# Patient Record
Sex: Female | Born: 1965
Health system: Southern US, Community
[De-identification: ages and names within clinical notes are randomized; demographics above are authoritative.]

## PROBLEM LIST (undated history)

## (undated) DIAGNOSIS — C44501 Unspecified malignant neoplasm of skin of breast: Secondary | ICD-10-CM

## (undated) DIAGNOSIS — F419 Anxiety disorder, unspecified: Secondary | ICD-10-CM

## (undated) DIAGNOSIS — R635 Abnormal weight gain: Secondary | ICD-10-CM

## (undated) DIAGNOSIS — C4492 Squamous cell carcinoma of skin, unspecified: Secondary | ICD-10-CM

## (undated) DIAGNOSIS — K5900 Constipation, unspecified: Secondary | ICD-10-CM

## (undated) DIAGNOSIS — D229 Melanocytic nevi, unspecified: Secondary | ICD-10-CM

## (undated) DIAGNOSIS — E559 Vitamin D deficiency, unspecified: Secondary | ICD-10-CM

## (undated) DIAGNOSIS — T7840XA Allergy, unspecified, initial encounter: Secondary | ICD-10-CM

## (undated) HISTORY — PX: MOLE REMOVAL: SHX2046

## (undated) HISTORY — DX: Squamous cell carcinoma of skin, unspecified: C44.92

## (undated) HISTORY — DX: Unspecified malignant neoplasm of skin of breast: C44.501

## (undated) HISTORY — DX: Constipation, unspecified: K59.00

## (undated) HISTORY — DX: Allergy, unspecified, initial encounter: T78.40XA

## (undated) HISTORY — PX: CHOLECYSTECTOMY: SHX55

## (undated) HISTORY — DX: Abnormal weight gain: R63.5

## (undated) HISTORY — PX: SKIN CANCER EXCISION: SHX779

## (undated) HISTORY — DX: Anxiety disorder, unspecified: F41.9

## (undated) HISTORY — DX: Melanocytic nevi, unspecified: D22.9

---
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
## 2001-04-23 ENCOUNTER — Ambulatory Visit (HOSPITAL_COMMUNITY): Admission: RE | Admit: 2001-04-23 | Discharge: 2001-04-23 | Payer: Self-pay

## 2001-04-23 ENCOUNTER — Encounter: Payer: Self-pay | Admitting: Family Medicine

## 2007-11-21 ENCOUNTER — Ambulatory Visit (HOSPITAL_COMMUNITY): Admission: RE | Admit: 2007-11-21 | Discharge: 2007-11-21 | Payer: Self-pay | Admitting: Family Medicine

## 2011-06-22 ENCOUNTER — Other Ambulatory Visit (HOSPITAL_COMMUNITY)
Admission: RE | Admit: 2011-06-22 | Discharge: 2011-06-22 | Disposition: A | Discharge status: Home / Self Care | Payer: BC Managed Care – PPO | Source: Ambulatory Visit | Attending: Obstetrics and Gynecology | Admitting: Obstetrics and Gynecology

## 2011-06-22 DIAGNOSIS — Z01419 Encounter for gynecological examination (general) (routine) without abnormal findings: Secondary | ICD-10-CM | POA: Insufficient documentation

## 2012-12-30 ENCOUNTER — Telehealth: Payer: Self-pay | Admitting: *Deleted

## 2012-12-30 NOTE — Telephone Encounter (Signed)
No period in 2.5 months but having hot flashes at nite and can't lose weight even with atkins, will check TSH and send results to me.

## 2013-01-08 ENCOUNTER — Telehealth: Payer: Self-pay | Admitting: Obstetrics and Gynecology

## 2013-01-08 NOTE — Telephone Encounter (Signed)
Complains of weight gain, got labs in February, tried Atkins. Make appt.

## 2013-01-09 ENCOUNTER — Encounter: Payer: Self-pay | Admitting: *Deleted

## 2013-01-09 DIAGNOSIS — F411 Generalized anxiety disorder: Secondary | ICD-10-CM | POA: Insufficient documentation

## 2013-01-09 DIAGNOSIS — F419 Anxiety disorder, unspecified: Secondary | ICD-10-CM

## 2013-01-10 ENCOUNTER — Encounter: Payer: Self-pay | Admitting: Adult Health

## 2013-01-10 ENCOUNTER — Ambulatory Visit (INDEPENDENT_AMBULATORY_CARE_PROVIDER_SITE_OTHER): Payer: BC Managed Care – PPO | Admitting: Adult Health

## 2013-01-10 VITALS — BP 120/80 | Ht 67.0 in | Wt 159.0 lb

## 2013-01-10 DIAGNOSIS — R635 Abnormal weight gain: Secondary | ICD-10-CM

## 2013-01-10 HISTORY — DX: Abnormal weight gain: R63.5

## 2013-01-10 NOTE — Patient Instructions (Addendum)
Increase exercise 1500 calories 30 carbs  Follow 4 weeks

## 2013-01-10 NOTE — Progress Notes (Signed)
Subjective:     Patient ID: Samantha Solis, female   DOB: [birth year], 47 y.o.   MRN: [id number]  HPI Samantha Solis is in complaining of weight gain while trying to diet. She has been doing the [employer] and says she gained 8 pounds, she has been walking a mile a day. She was eating 1300 calories per day. She also has hot flashes and cut the fan on, tried the patch and did not like.  Review of Systems Patient denies any headaches, blurred vision, shortness of breath, chest pain, abdominal pain, problems with bowel movements, urination, or intercourse. Says stress level better, changed jobs. Positives as in HPI Reviewed past medical,surgical, social and family history. Reviewed medications and allergies.     Objective:   Physical Exam Blood pressure 120/80, height 5\' 7"  (1.702 m), weight 159 lb (72.122 kg), last menstrual period 11/21/2012. Reviewed labs she had done 10/30/12. TSH 1.61, lipids normal, CBC and CMP normal   Talk only. Discussed eating 6 small meals a day, about 1500 calories, limit to about 30 carbs per day, increase activity. Assessment:      Weight gain    Plan:    As discussed above   Follow up in 4 weeks to weigh and discuss progress

## 2013-01-31 ENCOUNTER — Telehealth: Payer: Self-pay | Admitting: Adult Health

## 2013-01-31 NOTE — Telephone Encounter (Signed)
Feels better, taking b complex and 1/4-1/2 xanax

## 2013-02-07 ENCOUNTER — Ambulatory Visit: Payer: BC Managed Care – PPO | Admitting: Adult Health

## 2013-02-17 ENCOUNTER — Telehealth: Payer: Self-pay | Admitting: Adult Health

## 2013-02-17 NOTE — Telephone Encounter (Signed)
Started Saturday, prior period 3 months ago wants OCs to regulate cycle.to come 6/10 at 4:20 pm

## 2013-02-17 NOTE — Telephone Encounter (Signed)
Patient states started period on June 7,2014, prior to this, last period was three months ago. Patient wanted to update Cyril Mourning, NP of her recent period and to discuss BCP for period management. Pt does have an appointment in two weeks.

## 2013-02-18 ENCOUNTER — Encounter: Payer: Self-pay | Admitting: Adult Health

## 2013-02-18 ENCOUNTER — Ambulatory Visit (INDEPENDENT_AMBULATORY_CARE_PROVIDER_SITE_OTHER): Payer: BC Managed Care – PPO | Admitting: Adult Health

## 2013-02-18 VITALS — BP 100/66 | Ht 67.0 in | Wt 162.2 lb

## 2013-02-18 DIAGNOSIS — Z7689 Persons encountering health services in other specified circumstances: Secondary | ICD-10-CM

## 2013-02-18 MED ORDER — NORETHIN-ETH ESTRAD-FE BIPHAS 1 MG-10 MCG / 10 MCG PO TABS: 1.0000 | ORAL_TABLET | Freq: Every day | ORAL | Status: DC

## 2013-02-18 NOTE — Patient Instructions (Addendum)
Star pills today follow up in 3 months

## 2013-02-18 NOTE — Progress Notes (Signed)
Subjective:     Patient ID: Samantha Solis, female   DOB: June 10, 1966, 47 y.o.   MRN: [id number]  HPI Samantha Solis is a 47 year old white female in saying she had a period in March and none til June 7th, she said she felt better with her period, she was bloated before. Last migraine about 12 years ago.  Review of Systems Positives as in HPI  Reviewed past medical,surgical, social and family history. Reviewed medications and allergies.     Objective:   Physical Exam BP 100/66  Ht 5\' 7"  (1.702 m)  Wt 162 lb 3.2 oz (73.573 kg)  BMI 25.4 kg/m2  LMP 02/15/2013   Talk only. Will try low dose birth control pills, discussed with Dr.Eure Assessment:      Period management    Plan:      Trial lo loestrin, start today, Medication Samples have been provided to the patient.  Drug name: Lo loestrin  Qty: 4  LOT:  [id number] A Exp.Date: May 15  The patient has been instructed regarding the correct time, dose, and frequency of taking this medication, including desired effects and most common side effects.  Return in 4 months for pap and physical. GRIFFIN,JENNIFER 5:34 PM 02/18/2013

## 2013-02-28 ENCOUNTER — Ambulatory Visit: Payer: BC Managed Care – PPO | Admitting: Adult Health

## 2013-03-07 ENCOUNTER — Encounter: Payer: Self-pay | Admitting: Adult Health

## 2013-03-07 ENCOUNTER — Ambulatory Visit (INDEPENDENT_AMBULATORY_CARE_PROVIDER_SITE_OTHER): Payer: BC Managed Care – PPO | Admitting: Adult Health

## 2013-03-07 VITALS — BP 130/78 | Ht 67.0 in | Wt 162.0 lb

## 2013-03-07 DIAGNOSIS — N926 Irregular menstruation, unspecified: Secondary | ICD-10-CM | POA: Insufficient documentation

## 2013-03-07 HISTORY — DX: Irregular menstruation, unspecified: N92.6

## 2013-03-07 NOTE — Progress Notes (Signed)
Subjective:     Patient ID: Samantha Solis, female   DOB: 03-Feb-1966, 47 y.o.   MRN: [id number]  HPI Samantha Solis is back to review the pills and her cycles, and she is having some irregular bleeding. Has some nausea at times but no headaches.  Review of Systems Positives in HPI Reviewed past medical,surgical, social and family history. Reviewed medications and allergies.     Objective:   Physical Exam BP 130/78  Ht 5\' 7"  (1.702 m)  Wt 162 lb (73.483 kg)  BMI 25.37 kg/m2  LMP 03/04/2013   Talk only, discussed periods and pills, may try megace if OCs don't work, did discuss endo ablation Assessment:      irregular bleeding    Plan:      Continue Lo Loestrin for now Follow up in October for pap and physical

## 2013-03-07 NOTE — Patient Instructions (Addendum)
Continue OCs for now Follow up as scheduled in October for pap

## 2013-03-17 ENCOUNTER — Telehealth: Payer: Self-pay | Admitting: Adult Health

## 2013-03-17 NOTE — Telephone Encounter (Signed)
Pt states given lo loestrin for period management, states not working. Per Cyril Mourning, NP, will give samples of minastrin which is a higher dose BCP. Pt stated would pick up samples tomorrow.

## 2013-03-18 ENCOUNTER — Other Ambulatory Visit: Payer: Self-pay | Admitting: *Deleted

## 2013-03-18 MED ORDER — NORETHIN ACE-ETH ESTRAD-FE 1-20 MG-MCG(24) PO CHEW: 1.0000 mg | CHEWABLE_TABLET | Freq: Every day | ORAL | Status: DC

## 2013-05-07 ENCOUNTER — Telehealth: Payer: Self-pay | Admitting: Adult Health

## 2013-05-07 NOTE — Telephone Encounter (Signed)
It is ok to not take the brown pills and start new pack

## 2013-05-07 NOTE — Telephone Encounter (Signed)
Spoke with pt. On Minastrin. Started med in July. Had period x 10 days. Has 2 more days of white pills. Pt states you mentioned her not taking brown pills. What do you advise? Thanks!!!

## 2013-06-27 ENCOUNTER — Other Ambulatory Visit: Payer: BC Managed Care – PPO | Admitting: Adult Health

## 2013-07-11 ENCOUNTER — Other Ambulatory Visit: Payer: BC Managed Care – PPO | Admitting: Adult Health

## 2013-07-18 ENCOUNTER — Other Ambulatory Visit: Payer: BC Managed Care – PPO | Admitting: Adult Health

## 2014-07-13 ENCOUNTER — Encounter: Payer: Self-pay | Admitting: Adult Health

## 2015-12-23 ENCOUNTER — Ambulatory Visit: Payer: Self-pay | Admitting: Allergy and Immunology

## 2016-01-14 ENCOUNTER — Ambulatory Visit (INDEPENDENT_AMBULATORY_CARE_PROVIDER_SITE_OTHER): Payer: 59 | Admitting: Allergy and Immunology

## 2016-01-14 ENCOUNTER — Encounter: Payer: Self-pay | Admitting: Allergy and Immunology

## 2016-01-14 VITALS — BP 110/75 | HR 78 | Temp 98.3°F | Resp 16 | Ht 66.34 in | Wt 175.3 lb

## 2016-01-14 DIAGNOSIS — L209 Atopic dermatitis, unspecified: Secondary | ICD-10-CM

## 2016-01-14 DIAGNOSIS — J309 Allergic rhinitis, unspecified: Secondary | ICD-10-CM | POA: Diagnosis not present

## 2016-01-14 DIAGNOSIS — H1010 Acute atopic conjunctivitis, unspecified eye: Secondary | ICD-10-CM

## 2016-01-14 MED ORDER — PIMECROLIMUS 1 % EX CREA: TOPICAL_CREAM | Freq: Every day | CUTANEOUS | Status: DC | PRN

## 2016-01-14 NOTE — Patient Instructions (Signed)
Take Home Sheet  1. Avoidance: Mite, Mold and Pollen   2. Antihistamine: Zyrtec 10 mg by mouth once daily for runny nose or itching.   3. Nasal Spray:  Rhinocort AQ 1-2 spray(s) each nostril once daily for stuffy nose or drainage.    5. Eye Drops: Zaditor one drop(s) each eye twice daily for itchy eyes as needed.   6. Other: Avoid all fragranced soaps, lotions, detergents.      Moisturize skin consistently 2-4 times daily.      Begin trial of Elidel cream once daily to rash areas as needed. Information on allergy injections.  7. Nasal Saline wash each evening at shower time.   8. Follow up Visit: 6 weeks or sooner if needed.   Websites that have reliable Patient information: 1. American Academy of Asthma, Allergy, & Immunology: www.aaaai.org 2. Food Allergy Network: www.foodallergy.org 3. Mothers of Asthmatics: www.aanma.org 4. [place]: DiningCalendar.de 5. American College of Allergy, Asthma, & Immunology: https://robertson.info/ or www.acaai.org

## 2016-01-14 NOTE — Progress Notes (Signed)
NEW PATIENT NOTE  RE: Samantha Solis MRN: [id number] DOB: 1965/12/14 [facility] [place] [address] [place] [address] Date of Office Visit: 01/14/2016  Dear Samantha Cheadle, PA-C:  I had the pleasure of seeing Samantha Solis today in initial evaluation as you recall-- Subjective:  Samantha Solis is a 50 y.o. female who presents today for Allergy Testing; Eczema; and Allergies  Assessment:   1. Allergic rhinoconjunctivitis, seasonal and perennial hypersensitivities, significant.    2. Atopic dermatitis, with greater pruritus.     Plan:   Meds ordered this encounter  Medications  . pimecrolimus (ELIDEL) 1 % cream    Sig: Apply topically daily as needed.    Dispense:  100 g    Refill:  2   Patient Instructions  1. Avoidance: Mite, Mold and Pollen 2. Antihistamine: Zyrtec 10 mg by mouth once daily for runny nose or itching. 3. Nasal Spray:  Rhinocort AQ 1-2 spray(s) each nostril once daily for stuffy nose or drainage.  4. Eye Drops: Zaditor one drop(s) each eye twice daily for itchy eyes as needed. 5. Other: Avoid all fragranced soaps, lotions, detergents.      Moisturize skin consistently 2-4 times daily.      Begin trial of Elidel cream twice daily to rash areas as needed. 6. Information on allergy injections. 7. Nasal Saline wash each evening at shower time. 8. Follow up Visit: 6 weeks or sooner if needed.  HPI: Samantha Solis presents to the office with a 40 year history of rhinorrhea, congestion, sneezing, itchy watery eyes, and postnasal drip for which she feels pollen, outdoor, fluctuant weather patterns, and cigarette smoke are provoking factors to her symptoms.  In addition since childhood she has had difficulty with eczema, though she describes as mild, but now has concern that foods contribute to increasing itching or skin difficulties.  She is followed with dermatology.  Typically using moisturizers and previously hydrocortisone cream and Halog  steroid creams.  She recalls allergy testing as a child, +2 pollens and animal dander.  Recently she has thought banana, cantaloupe, oranges, strawberries and bacon may have triggered itching or mouth irritation.  She also questions there.  Next meals triggering itching but is not consistent.  No acute reactions of swelling, hives, dysphagia, difficulty breathing or other generalized concerns.  Denies ED or Urgent care visits, prednisone or antibiotic courses.  She tends to avoid most cosmetics except for Beauty control, latex and fragranced products, given concern for pruritus.  Medical History: Past Medical History  Diagnosis Date  . Anxiety   . Constipation   . Skin cancer of breast   . Weight gain 01/10/2013   Surgical History: Past Surgical History  Procedure Laterality Date  . Cesarean section    . Cholecystectomy    . Mole removal Left   . Skin cancer excision     Family History: Family History  Problem Relation Age of Onset  . Hypertension Mother   . Stroke Mother   . Aneurysm Mother     brain  . Hypertension Father   . Diabetes Father   . Stroke Father     stroke in eye  . Allergic rhinitis Father   . Eczema Father   . Immunodeficiency Father   . Food Allergy Father   . Hypertension Sister   . Allergic rhinitis Sister   . Diabetes Son   . Allergic rhinitis Son   . Eczema Son   . Heart attack Paternal Grandmother   .  Cancer Maternal Grandmother   . Angioedema Neg Hx   . Asthma Neg Hx   . Urticaria Neg Hx    Social History: Social History  . Marital Status: Married    Spouse Name: N/A  . Number of Children: One   . Years of Education: N/A   Social History Main Topics  . Smoking status: Never Smoker   . Smokeless tobacco: Never Used  . Alcohol Use: No  . Drug Use: No  . 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  Samantha Solis has a current medication list which includes the following prescription(s): alprazolam.  Drug Allergies: No Known Allergies  Environmental History: Samantha Solis lives in a 51 year old house for 15 years with wood floors, with central heat and air; stuffed mattress, non-feather pillow/comforter, indoor cat without humidifier or smokers.   Review of Systems  Constitutional: Negative for fever, weight loss and malaise/fatigue.       Childhood varicella disease.  HENT: Positive for congestion. Negative for ear pain, hearing loss, nosebleeds and sore throat.   Eyes: Negative for blurred vision, double vision, discharge and redness.  Respiratory: Negative for cough, sputum production, shortness of breath and wheezing.        Denies history of bronchitis and pneumonia.  Gastrointestinal: Negative for heartburn, nausea, vomiting, abdominal pain, diarrhea and constipation.  Genitourinary: Negative.   Musculoskeletal: Negative for myalgias and joint pain.  Skin: Positive for itching and rash.  Neurological: Negative.  Negative for dizziness, seizures, weakness and headaches.  Endo/Heme/Allergies: Positive for environmental allergies.       Denies sensitivity to aspirin, NSAIDs, stinging insects,  jewelry and cosmetics( uses only Beauty control).  Immunological: No chronic or recurring infections. Objective:   Filed Vitals:   01/14/16 1355  BP: 110/75  Pulse: 78  Temp: 98.3 F (36.8 C)  Resp: 16   SpO2 Readings from Last 1 Encounters:  01/14/16 98%   Physical Exam  Constitutional: She is well-developed, well-nourished, and in no distress.  HENT:  Head: Atraumatic.  Right Ear: Tympanic membrane and ear canal normal.  Left Ear: Tympanic membrane and ear canal normal.  Nose: Mucosal edema present. No rhinorrhea. No epistaxis.  Mouth/Throat: Oropharynx is clear and moist and mucous membranes are normal. No oropharyngeal exudate, posterior oropharyngeal edema or  posterior oropharyngeal erythema.  Eyes: Conjunctivae are normal.  Neck: Neck supple.  Cardiovascular: Normal rate, S1 normal and S2 normal.   No murmur heard. Pulmonary/Chest: Effort normal. She has no wheezes. She has no rhonchi. She has no rales.  Abdominal: Soft. Normal appearance and bowel sounds are normal.  Musculoskeletal: She exhibits no edema.  Lymphadenopathy:    She has no cervical adenopathy.  Neurological: She is alert.  Skin: Skin is warm, dry and intact. Rash (Scattered patchy scaling areas, greatest at arms, left foot and posterior neck.) noted. No cyanosis. Nails show no clubbing.   Diagnostics:     Skin testing: Strong reactivity to multiple grass/weed and oak/beech tree pollens, Multiple mold species, dust mite, cat hair, and dog epithelial.  Negative to selected foods.     Samantha Graeff M. Ishmael Holter, MD   cc: [name]

## 2016-01-15 ENCOUNTER — Encounter: Payer: Self-pay | Admitting: Allergy and Immunology

## 2016-02-22 ENCOUNTER — Ambulatory Visit (INDEPENDENT_AMBULATORY_CARE_PROVIDER_SITE_OTHER): Payer: 59 | Admitting: Allergy and Immunology

## 2016-02-22 ENCOUNTER — Encounter: Payer: Self-pay | Admitting: Allergy and Immunology

## 2016-02-22 VITALS — BP 100/60 | HR 71 | Temp 98.3°F | Resp 14

## 2016-02-22 DIAGNOSIS — J309 Allergic rhinitis, unspecified: Secondary | ICD-10-CM

## 2016-02-22 DIAGNOSIS — H1010 Acute atopic conjunctivitis, unspecified eye: Secondary | ICD-10-CM | POA: Diagnosis not present

## 2016-02-22 DIAGNOSIS — L209 Atopic dermatitis, unspecified: Secondary | ICD-10-CM | POA: Diagnosis not present

## 2016-02-22 MED ORDER — PIMECROLIMUS 1 % EX CREA: TOPICAL_CREAM | Freq: Every day | CUTANEOUS | Status: DC | PRN

## 2016-02-22 NOTE — Progress Notes (Signed)
     FOLLOW UP NOTE  RE: Samantha Solis MRN: [id number] DOB: [birth year] [facility] [address] [place], [place] [address] Date of Office Visit: 02/22/2016  Subjective:  Samantha Solis is a 50 y.o. female who presents today for Eczema and Allergic Rhinitis   Assessment:   1. Atopic dermatitis, Intermittent itching noted xerosis.   2. Allergic rhinoconjunctivitis.   3.      Recent plaque like lesion, consider psoriatic lesion. Plan:   Meds ordered this encounter  Medications  . pimecrolimus (ELIDEL) 1 % cream    Sig: Apply topically daily as needed.    Dispense:  100 g    Refill:  2  1.  Use Elidel twice daily to rash areas as previously discussed. 2.  Moisturize skin consistently--give trial of Vaniply or Vaseline. 3.  May consider returning to dermatology for biopsy of lesion nape of neck. 4.  Give trial of Allegra 180 mg--Solis to one tablet daily. 5.  Samantha Solis will call with update in the next 2-4 weeks.   6.  Continue Rhinocort and Zaditor.   7.  Follow-up in 6 months or sooner if needed.   HPI: Samantha Solis returns to the office in follow-up of allergic rhinoconjunctivitis and atopic dermatitis.  Since her initial evaluation in May, she found the Zyrtec significantly beneficial for nasal symptoms and decreasing itching but felt it triggered a headache, therefore has discontinued.  She finds, Rhinocort and Zaditor beneficial. She has attempted to moisturize with Cervae, Cetaphil and AmLactin but they all seemed more irritating and created sensitive/burning skin sensation and therefore returned to Vaseline intensive care,  (though this does not keep her skin well moisturized throughout the day.)  She did not pick up the Elidel as it appears it was sent to [facility] (her second pharmacy) and not [facility] outpatient [facility].  She denies any acute reactions, swelling or hives, but tends to minimize some foods (selected fruits)  as previously indicated as  possible trigger for itching  Denies ED or urgent care visits, prednisone or antibiotic courses. Reports sleep and activity are normal.  Samantha Solis has a current medication list which includes the following prescription(s): alprazolam and pimecrolimus.   Drug Allergies: No Known Allergies  Objective:   Filed Vitals:   02/22/16 1555  BP: 100/60  Pulse: 71  Temp: 98.3 F (36.8 C)  Resp: 14   SpO2 Readings from Last 1 Encounters:  02/22/16 96%   Physical Exam  Constitutional: She is well-developed, well-nourished, and in no distress.  HENT:  Head: Atraumatic.  Right Ear: Tympanic membrane and ear canal normal.  Left Ear: Tympanic membrane and ear canal normal.  Nose: Mucosal edema present. No rhinorrhea. No epistaxis.  Mouth/Throat: Oropharynx is clear and moist and mucous membranes are normal. No oropharyngeal exudate, posterior oropharyngeal edema or posterior oropharyngeal erythema.  Neck: Neck supple.  Cardiovascular: Normal rate, S1 normal and S2 normal.   No murmur heard. Pulmonary/Chest: Effort normal. She has no wheezes. She has no rhonchi. She has no rales.  Lymphadenopathy:    She has no cervical adenopathy.  Skin: Skin is warm and dry. No cyanosis. Nails show no clubbing.  Generalized dryness, scattered papular erythematous scratched areas at upper and lower extremities with patchy nearly plaque like area at nape of neck with surrounding erythema.     [name] Ishmael Holter, MD  cc: Samantha Solis

## 2016-02-22 NOTE — Patient Instructions (Addendum)
   Continue current medication regime.  Moisturize skin consistently.  Follow-up in 6 months or sooner if needed.

## 2016-02-23 MED FILL — ELIDEL 1% CREAM: 1 | 90 days supply | Qty: 100 | Fill #0

## 2016-02-28 ENCOUNTER — Other Ambulatory Visit (HOSPITAL_COMMUNITY): Payer: Self-pay | Admitting: General Surgery

## 2016-02-28 DIAGNOSIS — Z1231 Encounter for screening mammogram for malignant neoplasm of breast: Secondary | ICD-10-CM

## 2016-03-01 ENCOUNTER — Ambulatory Visit (HOSPITAL_COMMUNITY)
Admission: RE | Admit: 2016-03-01 | Discharge: 2016-03-01 | Disposition: A | Discharge status: Home / Self Care | Payer: 59 | Source: Ambulatory Visit | Attending: General Surgery | Admitting: General Surgery

## 2016-03-01 DIAGNOSIS — Z1231 Encounter for screening mammogram for malignant neoplasm of breast: Secondary | ICD-10-CM | POA: Diagnosis not present

## 2016-03-03 ENCOUNTER — Ambulatory Visit (HOSPITAL_COMMUNITY): Payer: Self-pay

## 2016-03-31 ENCOUNTER — Ambulatory Visit: Payer: 59 | Admitting: Allergy and Immunology

## 2016-05-16 ENCOUNTER — Ambulatory Visit: Payer: 59 | Admitting: Allergy & Immunology

## 2016-06-26 DIAGNOSIS — M546 Pain in thoracic spine: Secondary | ICD-10-CM | POA: Diagnosis not present

## 2016-06-26 DIAGNOSIS — S338XXA Sprain of other parts of lumbar spine and pelvis, initial encounter: Secondary | ICD-10-CM | POA: Diagnosis not present

## 2016-06-26 DIAGNOSIS — S134XXA Sprain of ligaments of cervical spine, initial encounter: Secondary | ICD-10-CM | POA: Diagnosis not present

## 2016-06-29 DIAGNOSIS — M546 Pain in thoracic spine: Secondary | ICD-10-CM | POA: Diagnosis not present

## 2016-06-29 DIAGNOSIS — S134XXA Sprain of ligaments of cervical spine, initial encounter: Secondary | ICD-10-CM | POA: Diagnosis not present

## 2016-06-29 DIAGNOSIS — S338XXA Sprain of other parts of lumbar spine and pelvis, initial encounter: Secondary | ICD-10-CM | POA: Diagnosis not present

## 2016-07-05 DIAGNOSIS — S338XXA Sprain of other parts of lumbar spine and pelvis, initial encounter: Secondary | ICD-10-CM | POA: Diagnosis not present

## 2016-07-05 DIAGNOSIS — S134XXA Sprain of ligaments of cervical spine, initial encounter: Secondary | ICD-10-CM | POA: Diagnosis not present

## 2016-07-05 DIAGNOSIS — M546 Pain in thoracic spine: Secondary | ICD-10-CM | POA: Diagnosis not present

## 2016-07-07 DIAGNOSIS — H5213 Myopia, bilateral: Secondary | ICD-10-CM | POA: Diagnosis not present

## 2016-07-07 DIAGNOSIS — H524 Presbyopia: Secondary | ICD-10-CM | POA: Diagnosis not present

## 2016-07-11 ENCOUNTER — Ambulatory Visit: Payer: 59 | Admitting: Allergy & Immunology

## 2016-12-06 DIAGNOSIS — Z124 Encounter for screening for malignant neoplasm of cervix: Secondary | ICD-10-CM | POA: Diagnosis not present

## 2016-12-06 DIAGNOSIS — Z6827 Body mass index (BMI) 27.0-27.9, adult: Secondary | ICD-10-CM | POA: Diagnosis not present

## 2016-12-06 DIAGNOSIS — Z01419 Encounter for gynecological examination (general) (routine) without abnormal findings: Secondary | ICD-10-CM | POA: Diagnosis not present

## 2017-06-27 DIAGNOSIS — Z6828 Body mass index (BMI) 28.0-28.9, adult: Secondary | ICD-10-CM | POA: Diagnosis not present

## 2017-06-27 DIAGNOSIS — R51 Headache: Secondary | ICD-10-CM | POA: Diagnosis not present

## 2017-06-27 DIAGNOSIS — Z1389 Encounter for screening for other disorder: Secondary | ICD-10-CM | POA: Diagnosis not present

## 2017-06-27 DIAGNOSIS — L089 Local infection of the skin and subcutaneous tissue, unspecified: Secondary | ICD-10-CM | POA: Diagnosis not present

## 2017-07-27 DIAGNOSIS — H5213 Myopia, bilateral: Secondary | ICD-10-CM | POA: Diagnosis not present

## 2017-07-27 DIAGNOSIS — H524 Presbyopia: Secondary | ICD-10-CM | POA: Diagnosis not present

## 2017-09-12 DIAGNOSIS — S134XXA Sprain of ligaments of cervical spine, initial encounter: Secondary | ICD-10-CM | POA: Diagnosis not present

## 2017-09-12 DIAGNOSIS — S338XXA Sprain of other parts of lumbar spine and pelvis, initial encounter: Secondary | ICD-10-CM | POA: Diagnosis not present

## 2017-09-12 DIAGNOSIS — M546 Pain in thoracic spine: Secondary | ICD-10-CM | POA: Diagnosis not present

## 2017-09-17 DIAGNOSIS — S338XXA Sprain of other parts of lumbar spine and pelvis, initial encounter: Secondary | ICD-10-CM | POA: Diagnosis not present

## 2017-09-17 DIAGNOSIS — S134XXA Sprain of ligaments of cervical spine, initial encounter: Secondary | ICD-10-CM | POA: Diagnosis not present

## 2017-09-17 DIAGNOSIS — M546 Pain in thoracic spine: Secondary | ICD-10-CM | POA: Diagnosis not present

## 2017-11-09 ENCOUNTER — Ambulatory Visit: Payer: Self-pay | Admitting: Allergy

## 2017-11-23 ENCOUNTER — Ambulatory Visit: Payer: 59 | Admitting: Allergy

## 2018-02-20 DIAGNOSIS — Z01419 Encounter for gynecological examination (general) (routine) without abnormal findings: Secondary | ICD-10-CM | POA: Diagnosis not present

## 2018-02-20 DIAGNOSIS — Z0000 Encounter for general adult medical examination without abnormal findings: Secondary | ICD-10-CM | POA: Diagnosis not present

## 2018-02-20 DIAGNOSIS — Z124 Encounter for screening for malignant neoplasm of cervix: Secondary | ICD-10-CM | POA: Diagnosis not present

## 2018-02-20 DIAGNOSIS — Z6828 Body mass index (BMI) 28.0-28.9, adult: Secondary | ICD-10-CM | POA: Diagnosis not present

## 2018-04-15 ENCOUNTER — Ambulatory Visit (INDEPENDENT_AMBULATORY_CARE_PROVIDER_SITE_OTHER): Payer: 59 | Admitting: Otolaryngology

## 2018-04-15 DIAGNOSIS — H6983 Other specified disorders of Eustachian tube, bilateral: Secondary | ICD-10-CM | POA: Diagnosis not present

## 2018-04-15 DIAGNOSIS — H93293 Other abnormal auditory perceptions, bilateral: Secondary | ICD-10-CM | POA: Diagnosis not present

## 2018-05-08 DIAGNOSIS — N959 Unspecified menopausal and perimenopausal disorder: Secondary | ICD-10-CM | POA: Diagnosis not present

## 2018-05-08 DIAGNOSIS — Z0389 Encounter for observation for other suspected diseases and conditions ruled out: Secondary | ICD-10-CM | POA: Diagnosis not present

## 2018-05-08 DIAGNOSIS — R5383 Other fatigue: Secondary | ICD-10-CM | POA: Diagnosis not present

## 2018-05-08 DIAGNOSIS — L209 Atopic dermatitis, unspecified: Secondary | ICD-10-CM | POA: Diagnosis not present

## 2018-05-28 DIAGNOSIS — R6882 Decreased libido: Secondary | ICD-10-CM | POA: Diagnosis not present

## 2018-05-28 DIAGNOSIS — N959 Unspecified menopausal and perimenopausal disorder: Secondary | ICD-10-CM | POA: Diagnosis not present

## 2018-05-28 DIAGNOSIS — E875 Hyperkalemia: Secondary | ICD-10-CM | POA: Diagnosis not present

## 2018-05-28 DIAGNOSIS — R5383 Other fatigue: Secondary | ICD-10-CM | POA: Diagnosis not present

## 2018-05-28 DIAGNOSIS — E663 Overweight: Secondary | ICD-10-CM | POA: Diagnosis not present

## 2018-05-28 DIAGNOSIS — E559 Vitamin D deficiency, unspecified: Secondary | ICD-10-CM | POA: Diagnosis not present

## 2018-07-11 DIAGNOSIS — E663 Overweight: Secondary | ICD-10-CM | POA: Diagnosis not present

## 2018-07-11 DIAGNOSIS — R6882 Decreased libido: Secondary | ICD-10-CM | POA: Diagnosis not present

## 2018-07-11 DIAGNOSIS — E559 Vitamin D deficiency, unspecified: Secondary | ICD-10-CM | POA: Diagnosis not present

## 2018-07-11 DIAGNOSIS — N959 Unspecified menopausal and perimenopausal disorder: Secondary | ICD-10-CM | POA: Diagnosis not present

## 2018-08-12 MED FILL — ESTRADIOL 0.5 MG TABLET: 0.5 | 30 days supply | Qty: 30 | Fill #0

## 2018-08-12 MED FILL — PROGESTERONE 100 MG CAPSULE: 100 | 30 days supply | Qty: 30 | Fill #0

## 2018-08-12 MED FILL — NP THYROID 30 MG TABLET: 30 | 30 days supply | Qty: 30 | Fill #0

## 2018-08-27 DIAGNOSIS — E663 Overweight: Secondary | ICD-10-CM | POA: Diagnosis not present

## 2018-08-27 DIAGNOSIS — N959 Unspecified menopausal and perimenopausal disorder: Secondary | ICD-10-CM | POA: Diagnosis not present

## 2018-08-27 DIAGNOSIS — R6882 Decreased libido: Secondary | ICD-10-CM | POA: Diagnosis not present

## 2018-08-28 MED FILL — ESTRADIOL 1 MG TABLET: 1 | 30 days supply | Qty: 30 | Fill #0

## 2018-08-28 MED FILL — PROGESTERONE MICRONIZED 200: 200 | 30 days supply | Qty: 30 | Fill #0

## 2018-09-18 DIAGNOSIS — M546 Pain in thoracic spine: Secondary | ICD-10-CM | POA: Diagnosis not present

## 2018-09-18 DIAGNOSIS — S338XXA Sprain of other parts of lumbar spine and pelvis, initial encounter: Secondary | ICD-10-CM | POA: Diagnosis not present

## 2018-09-18 DIAGNOSIS — S134XXA Sprain of ligaments of cervical spine, initial encounter: Secondary | ICD-10-CM | POA: Diagnosis not present

## 2018-09-18 MED FILL — NP THYROID 30 MG TABLET: 30 | 30 days supply | Qty: 60 | Fill #0

## 2018-09-19 DIAGNOSIS — B348 Other viral infections of unspecified site: Secondary | ICD-10-CM | POA: Diagnosis not present

## 2018-09-19 DIAGNOSIS — K5289 Other specified noninfective gastroenteritis and colitis: Secondary | ICD-10-CM | POA: Diagnosis not present

## 2018-12-05 ENCOUNTER — Encounter (INDEPENDENT_AMBULATORY_CARE_PROVIDER_SITE_OTHER): Payer: Self-pay | Admitting: Nurse Practitioner

## 2018-12-10 DIAGNOSIS — E663 Overweight: Secondary | ICD-10-CM | POA: Diagnosis not present

## 2018-12-10 DIAGNOSIS — E559 Vitamin D deficiency, unspecified: Secondary | ICD-10-CM | POA: Diagnosis not present

## 2018-12-10 DIAGNOSIS — R030 Elevated blood-pressure reading, without diagnosis of hypertension: Secondary | ICD-10-CM | POA: Diagnosis not present

## 2019-01-09 DIAGNOSIS — Z1159 Encounter for screening for other viral diseases: Secondary | ICD-10-CM | POA: Diagnosis not present

## 2019-01-09 DIAGNOSIS — R030 Elevated blood-pressure reading, without diagnosis of hypertension: Secondary | ICD-10-CM | POA: Diagnosis not present

## 2019-01-09 DIAGNOSIS — Z13220 Encounter for screening for lipoid disorders: Secondary | ICD-10-CM | POA: Diagnosis not present

## 2019-01-09 DIAGNOSIS — E559 Vitamin D deficiency, unspecified: Secondary | ICD-10-CM | POA: Diagnosis not present

## 2019-01-09 DIAGNOSIS — Z131 Encounter for screening for diabetes mellitus: Secondary | ICD-10-CM | POA: Diagnosis not present

## 2019-01-09 DIAGNOSIS — Z113 Encounter for screening for infections with a predominantly sexual mode of transmission: Secondary | ICD-10-CM | POA: Diagnosis not present

## 2019-01-09 DIAGNOSIS — R5383 Other fatigue: Secondary | ICD-10-CM | POA: Diagnosis not present

## 2019-01-09 DIAGNOSIS — E663 Overweight: Secondary | ICD-10-CM | POA: Diagnosis not present

## 2019-01-09 DIAGNOSIS — Z139 Encounter for screening, unspecified: Secondary | ICD-10-CM | POA: Diagnosis not present

## 2019-01-13 DIAGNOSIS — S134XXA Sprain of ligaments of cervical spine, initial encounter: Secondary | ICD-10-CM | POA: Diagnosis not present

## 2019-01-13 DIAGNOSIS — M546 Pain in thoracic spine: Secondary | ICD-10-CM | POA: Diagnosis not present

## 2019-01-13 DIAGNOSIS — S338XXA Sprain of other parts of lumbar spine and pelvis, initial encounter: Secondary | ICD-10-CM | POA: Diagnosis not present

## 2019-02-26 DIAGNOSIS — Z01419 Encounter for gynecological examination (general) (routine) without abnormal findings: Secondary | ICD-10-CM | POA: Diagnosis not present

## 2019-02-26 DIAGNOSIS — Z124 Encounter for screening for malignant neoplasm of cervix: Secondary | ICD-10-CM | POA: Diagnosis not present

## 2019-02-26 DIAGNOSIS — Z1151 Encounter for screening for human papillomavirus (HPV): Secondary | ICD-10-CM | POA: Diagnosis not present

## 2019-03-24 MED FILL — ESCITALOPRAM 10 MG TABLET: 10 | 90 days supply | Qty: 90 | Fill #0

## 2019-06-04 ENCOUNTER — Encounter (INDEPENDENT_AMBULATORY_CARE_PROVIDER_SITE_OTHER): Payer: Self-pay | Admitting: Internal Medicine

## 2019-06-04 ENCOUNTER — Encounter (INDEPENDENT_AMBULATORY_CARE_PROVIDER_SITE_OTHER): Payer: Self-pay

## 2019-06-04 ENCOUNTER — Ambulatory Visit (INDEPENDENT_AMBULATORY_CARE_PROVIDER_SITE_OTHER): Payer: 59 | Admitting: Internal Medicine

## 2019-06-04 ENCOUNTER — Other Ambulatory Visit: Payer: Self-pay

## 2019-06-04 VITALS — BP 126/80 | HR 72 | Ht 67.0 in | Wt 184.0 lb

## 2019-06-04 DIAGNOSIS — R635 Abnormal weight gain: Secondary | ICD-10-CM | POA: Diagnosis not present

## 2019-06-04 DIAGNOSIS — F419 Anxiety disorder, unspecified: Secondary | ICD-10-CM

## 2019-06-04 DIAGNOSIS — E782 Mixed hyperlipidemia: Secondary | ICD-10-CM

## 2019-06-04 DIAGNOSIS — E559 Vitamin D deficiency, unspecified: Secondary | ICD-10-CM

## 2019-06-04 HISTORY — DX: Vitamin D deficiency, unspecified: E55.9

## 2019-06-04 LAB — LIPID PANEL
Cholesterol: 211 mg/dL — ABNORMAL HIGH (ref <200)
HDL: 60 mg/dL (ref >=50)
LDL Cholesterol (Calc): 135 mg/dL (calc) — ABNORMAL HIGH
Non-HDL Cholesterol (Calc): 151 mg/dL (calc) — ABNORMAL HIGH (ref <130)
Total CHOL/HDL Ratio: 3.5 (calc) (ref <5.0)
Triglycerides: 66 mg/dL (ref <150)

## 2019-06-04 LAB — VITAMIN D 25 HYDROXY (VIT D DEFICIENCY, FRACTURES): Vit D, 25-Hydroxy: 44 ng/mL (ref 30–100)

## 2019-06-04 NOTE — Progress Notes (Signed)
Wellness Office Visit  Subjective:  Patient ID: Samantha Solis, female    DOB: 01-Nov-1965  Age: 53 y.o. MRN: [id number]  CC: This lady comes in for follow-up regarding her weight gain, vitamin D deficiency and hyperlipidemia. HPI  She is postmenopausal and we had tried her on bioidentical hormone therapy but she was really unable to tolerate any of it. She is frustrated that she cannot lose weight. She has not been taking vitamin D3 as she did not tolerate it from the source that she obtained from previously. She is on Lexapro which was prescribed by her gynecologist as she has had difficulty with the loss and murder of her mother about a year ago. Past Medical History:  Diagnosis Date  . Anxiety   . Constipation   . Skin cancer of breast   . Vitamin D deficiency disease 06/04/2019  . Weight gain 01/10/2013      Family History  Problem Relation Age of Onset  . Hypertension Mother   . Stroke Mother   . Aneurysm Mother        brain  . Hypertension Father   . Diabetes Father   . Stroke Father        stroke in eye  . Allergic rhinitis Father   . Eczema Father   . Immunodeficiency Father   . Food Allergy Father   . Hypertension Sister   . Allergic rhinitis Sister   . Diabetes Son   . Allergic rhinitis Son   . Eczema Son   . Heart attack Paternal Grandmother   . Cancer Maternal Grandmother   . Angioedema Neg Hx   . Asthma Neg Hx   . Urticaria Neg Hx     Social History   Social History Narrative  . Not on file     Current Meds  Medication Sig  . escitalopram (LEXAPRO) 10 MG tablet Take 10 mg by mouth daily.     Nutrition  She typically ends up fasting for 16 hours on most days. Sleep  Adequate.  Exercise  None regular. Bio Identical Hormones  None.  Objective:   Today's Vitals: BP 126/80   Pulse 72   Ht 5\' 7"  (1.702 m)   Wt 184 lb (83.5 kg)   LMP 03/04/2013   BMI 28.82 kg/m  Vitals with BMI 06/04/2019 02/22/2016 01/14/2016  Height 5\' 7"  -  5' 6.339"  Weight 184 lbs - 175 lbs 4 oz  BMI [id number] - [id number]  Systolic [id number]  Diastolic 80 60 75  Pulse 72 71 78     Physical Exam   She looks systemically well but she is overweight.  No new physical findings.    Assessment   1. Anxiety   2. Weight gain   3. Vitamin D deficiency disease   4. Mixed hyperlipidemia       Tests ordered Orders Placed This Encounter  Procedures  . Lipid panel  . VITAMIN D 25 Hydroxy (Vit-D Deficiency, Fractures)     Plan: 1. Blood tests were ordered as above. 2. We discussed nutrition again and the concept of intermittent fasting and keeping insulin levels lower.  I discussed the importance of hydration and salt intake when she has prolonged fasting which is something I would recommend now.  Also, she needs to start exercising on a regular basis.  She has a home gym already and her husband exercises on a daily basis so I recommended that she start doing something.  Walking 30 minutes  briskly every day has many benefits. 3. I will see her in about 3 months time for follow-up to see how she is doing. 4. I spent 30 minutes with this patient face-to-face, more than 50% of the time was involved in discussing her overall health and nutrition above.     Doree Albee, MD

## 2019-06-04 NOTE — Patient Instructions (Addendum)
[facility] Dietary Recommendations for Weight Loss What to Avoid . Avoid added sugars o Often added sugar can be found in processed foods such as many condiments, dry cereals, cakes, cookies, chips, crisps, crackers, candies, sweetened drinks, etc.  o Read labels and AVOID/DECREASE use of foods with the following in their ingredient list: Sugar, fructose, high fructose corn syrup, sucrose, glucose, maltose, dextrose, molasses, cane sugar, brown sugar, any type of syrup, agave nectar, etc.   . Avoid snacking in between meals . Avoid foods made with flour o If you are going to eat food made with flour, choose those made with whole-grains; and, minimize your consumption as much as is tolerable . Avoid processed foods o These foods are generally stocked in the middle of the grocery store. Focus on shopping on the perimeter of the grocery.  What to Include . Vegetables o GREEN LEAFY VEGETABLES: Kale, spinach, mustard greens, collard greens, cabbage, broccoli, etc. o OTHER: Asparagus, cauliflower, eggplant, carrots, peas, Brussel sprouts, tomatoes, bell peppers, zucchini, beets, cucumbers, etc. . Grains, seeds, and legumes o Beans: kidney beans, black eyed peas, garbanzo beans, black beans, pinto beans, etc. o Whole, unrefined grains: brown rice, barley, bulgur, oatmeal, etc. . Healthy fats  o Avoid highly processed fats such as vegetable oil o Examples of healthy fats: avocado, olives, virgin olive oil, dark chocolate (?72% Cocoa), nuts (peanuts, almonds, walnuts, cashews, pecans, etc.) . Low - Moderate Intake of Animal Sources of Protein o Meat sources: chicken, turkey, salmon, tuna. Limit to 4 ounces of meat at one time. o Consider limiting dairy sources, but when choosing dairy focus on: PLAIN Greek yogurt, cottage cheese, high-protein milk . Fruit o Choose berries  When to Eat . Intermittent Fasting: o Choosing not to eat for a specific time period, but DO FOCUS ON HYDRATION  when fasting o Multiple Techniques: - Time Restricted Eating: eat 3 meals in a day, each meal lasting no more than 60 minutes, no snacks between meals - 16-18 hour fast: fast for 16 to 18 hours up to 7 days a week. Often suggested to start with 2-3 nonconsecutive days per week.  . Remember the time you sleep is counted as fasting.  . Examples of eating schedule: Fast from 7:00pm-11:00am. Eat between 11:00am-7:00pm.  - 24-hour fast: fast for 24 hours up to every other day. Often suggested to start with 1 day per week . Remember the time you sleep is counted as fasting . Examples of eating schedule:  o Eating day: eat 2-3 meals on your eating day. If doing 2 meals, each meal should last no more than 90 minutes. If doing 3 meals, each meal should last no more than 60 minutes. Finish last meal by 7:00pm. o Fasting day: Fast until 7:00pm.  o IF YOU FEEL UNWELL FOR ANY REASON/IN ANY WAY WHEN FASTING, STOP FASTING BY EATING A NUTRITIOUS SNACK OR LIGHT MEAL o ALWAYS FOCUS ON HYDRATION DURING FASTS - Acceptable Hydration sources: water, broths, tea/coffee (black tea/coffee is best but using a small amount of whole-fat dairy products in coffee/tea is acceptable).  - Poor Hydration Sources: anything with sugar or artificial sweeteners added to it  These recommendations have been developed for patients that are actively receiving medical care from either Dr. Lerry Cordrey or Sarah Gray, DNP, NP-C at [facility]. These recommendations are developed for patients with specific medical conditions and are not meant to be distributed or used by others that are not actively receiving care from either provider listed   above at [facility]. It is not appropriate to participate in the above eating plans without proper medical supervision.   Reference: Rexanne Mano. The obesity code. [place]; 2016.  www.lifeextension.com  Vitamin D3 5000 units/day

## 2019-06-05 NOTE — Progress Notes (Signed)
Your cholesterol is slightly elevated, does not require medication for this but need to work on the diet.  Vitamin D levels are not optimal.  Make sure you are taking vitamin D3 10,000 units daily.  If you have any questions, do not hesitate to contact me via my chart.  Be well!

## 2019-06-19 MED FILL — ESCITALOPRAM 10 MG TABLET: 10 | 90 days supply | Qty: 90 | Fill #1

## 2019-08-11 DIAGNOSIS — S338XXA Sprain of other parts of lumbar spine and pelvis, initial encounter: Secondary | ICD-10-CM | POA: Diagnosis not present

## 2019-08-11 DIAGNOSIS — M546 Pain in thoracic spine: Secondary | ICD-10-CM | POA: Diagnosis not present

## 2019-08-11 DIAGNOSIS — S134XXA Sprain of ligaments of cervical spine, initial encounter: Secondary | ICD-10-CM | POA: Diagnosis not present

## 2019-09-08 ENCOUNTER — Ambulatory Visit (INDEPENDENT_AMBULATORY_CARE_PROVIDER_SITE_OTHER): Payer: 59 | Admitting: Internal Medicine

## 2019-09-15 ENCOUNTER — Ambulatory Visit (INDEPENDENT_AMBULATORY_CARE_PROVIDER_SITE_OTHER): Payer: 59 | Admitting: Internal Medicine

## 2019-10-13 ENCOUNTER — Ambulatory Visit (INDEPENDENT_AMBULATORY_CARE_PROVIDER_SITE_OTHER): Payer: 59 | Admitting: Internal Medicine

## 2019-10-27 ENCOUNTER — Ambulatory Visit (INDEPENDENT_AMBULATORY_CARE_PROVIDER_SITE_OTHER): Payer: 59 | Admitting: Nurse Practitioner

## 2019-11-24 ENCOUNTER — Ambulatory Visit (INDEPENDENT_AMBULATORY_CARE_PROVIDER_SITE_OTHER): Payer: 59 | Admitting: Nurse Practitioner

## 2019-12-08 ENCOUNTER — Ambulatory Visit (INDEPENDENT_AMBULATORY_CARE_PROVIDER_SITE_OTHER): Payer: 59 | Admitting: Nurse Practitioner

## 2019-12-18 ENCOUNTER — Ambulatory Visit (INDEPENDENT_AMBULATORY_CARE_PROVIDER_SITE_OTHER): Payer: 59 | Admitting: Nurse Practitioner

## 2019-12-18 ENCOUNTER — Encounter (INDEPENDENT_AMBULATORY_CARE_PROVIDER_SITE_OTHER): Payer: Self-pay | Admitting: Nurse Practitioner

## 2019-12-18 ENCOUNTER — Other Ambulatory Visit: Payer: Self-pay

## 2019-12-18 VITALS — BP 125/75 | HR 67 | Temp 97.7°F | Ht 67.0 in | Wt 184.6 lb

## 2019-12-18 DIAGNOSIS — R635 Abnormal weight gain: Secondary | ICD-10-CM

## 2019-12-18 DIAGNOSIS — E559 Vitamin D deficiency, unspecified: Secondary | ICD-10-CM | POA: Diagnosis not present

## 2019-12-18 DIAGNOSIS — E782 Mixed hyperlipidemia: Secondary | ICD-10-CM | POA: Insufficient documentation

## 2019-12-18 DIAGNOSIS — F419 Anxiety disorder, unspecified: Secondary | ICD-10-CM | POA: Diagnosis not present

## 2019-12-18 NOTE — Progress Notes (Signed)
Subjective:  Patient ID: Samantha Solis, female    DOB: 11/06/1965  Age: 54 y.o. MRN: [id number]  CC:  Chief Complaint  Patient presents with  . Anxiety  . Hyperlipidemia  . Follow-up    Obesity, vitamin D deficiency      HPI  This patient comes in today for the above.  Anxiety: She has a history of anxiety and continues on Lexapro 5 mg daily.  She tells me she had tried stopping it recently, however did not tolerate stopping the medication so she has restarted it.  She denies any suicidal ideation and reports feeling well on medication.  Hyperlipidemia: She has a history of hyperlipidemia.  Last lipid panel was collected in September 2020.  It showed LDL at 135 until cholesterol 211, HDL was 60 triglycerides are 66.  ASCVD risk score is approximately 1.7% she is not on statin therapy or low-dose aspirin.  Obesity: She has a history of obesity and has tried weight loss in the past.  She is currently participating in intermittent fasting.  She tells me she fast for approximately 19 to 20 hours out of the day and tolerates this well.  She was told to start a walking regimen at last office visit with Dr. Anastasio Champion, but has not yet started this.  She has been experiencing a lot of stress due to the sudden death of her mother in motor vehicle accident approximately 18 months ago.  She tells me the person that was at fault for the accident has gone to trial and has been sentenced to jail, thus she feels that she can start to move on emotionally.  She plans to start being more active as she is able.  Vitamin D deficiency: It was recommended that she be on 10,000 IUs of vitamin D3 daily.  Last serum level was collected in September 2020 was 44.  She is not taking an over-the-counter pill of vitamin D3 but she is taking the liquid version, she is not exactly sure what dose she is currently ingesting, because she is taking a small amount to see how she tolerates it.   Past Medical History:    Diagnosis Date  . Anxiety   . Constipation   . Skin cancer of breast   . Vitamin D deficiency disease 06/04/2019  . Weight gain 01/10/2013      Family History  Problem Relation Age of Onset  . Hypertension Mother   . Stroke Mother   . Aneurysm Mother        brain  . Hypertension Father   . Diabetes Father   . Stroke Father        stroke in eye  . Allergic rhinitis Father   . Eczema Father   . Immunodeficiency Father   . Food Allergy Father   . Hypertension Sister   . Allergic rhinitis Sister   . Diabetes Son   . Allergic rhinitis Son   . Eczema Son   . Heart attack Paternal Grandmother   . Cancer Maternal Grandmother   . Angioedema Neg Hx   . Asthma Neg Hx   . Urticaria Neg Hx     Social History   Social History Narrative  . Not on file   Social History   Tobacco Use  . Smoking status: Never Smoker  . Smokeless tobacco: Never Used  Substance Use Topics  . Alcohol use: No     Current Meds  Medication Sig  . ALPRAZolam Duanne Moron) 1  MG tablet   . escitalopram (LEXAPRO) 10 MG tablet Take 5 mg by mouth daily.   Marland Kitchen VITAMIN D, CHOLECALCIFEROL, PO Take by mouth. Unknown current dose; patient is slowly titration up    ROS:  Review of Systems  Constitutional: Negative for diaphoresis, fever, malaise/fatigue and weight loss.  Eyes: Negative for blurred vision.  Cardiovascular: Negative for chest pain and palpitations.  Gastrointestinal: Negative for abdominal pain and blood in stool.  Neurological: Negative for dizziness and headaches.  Psychiatric/Behavioral: Negative for suicidal ideas.     Objective:   Today's Vitals: BP 125/75 (BP Location: Left Arm, Patient Position: Sitting, Cuff Size: Normal)   Pulse 67   Temp 97.7 F (36.5 C) (Temporal)   Ht 5\' 7"  (1.702 m)   Wt 184 lb 9.6 oz (83.7 kg)   LMP 03/04/2013   SpO2 98%   BMI 28.91 kg/m  Vitals with BMI 12/18/2019 06/04/2019 02/22/2016  Height 5\' 7"  5\' 7"  -  Weight 184 lbs 10 oz 184 lbs -  BMI 0000000  [id number] -  Systolic 0000000 [id number]  Diastolic 75 80 60  Pulse 67 72 71     Physical Exam Vitals reviewed.  Constitutional:      General: She is not in acute distress.    Appearance: Normal appearance.  HENT:     Head: Normocephalic and atraumatic.  Neck:     Vascular: No carotid bruit.  Cardiovascular:     Rate and Rhythm: Normal rate and regular rhythm.     Pulses: Normal pulses.     Heart sounds: Normal heart sounds.  Pulmonary:     Effort: Pulmonary effort is normal.     Breath sounds: Normal breath sounds.  Skin:    General: Skin is warm and dry.  Neurological:     General: No focal deficit present.     Mental Status: She is alert and oriented to person, place, and time.  Psychiatric:        Mood and Affect: Mood normal.        Behavior: Behavior normal.        Judgment: Judgment normal.          Assessment and Plan   1. Anxiety   2. Vitamin D deficiency disease   3. Mixed hyperlipidemia   4. Weight gain      Plan: 1.  She will continue on her current medication regimen as prescribed.  2.  I encouraged her to look at the bottle/box of her vitamin D supplement so she knows how many IUs of vitamin D3 are in each milliliter of her medication.  I recommended that she try to get 10,000 IUs of vitamin D3 daily as she is able to tolerate the medication.  We will need to consider collecting serum level at next office visit.  3.  We discussed her lipid panel as well as her ASCVD score.  I encouraged her to continue participating in intermittent fasting and following a healthy diet.  Will not start on medication to treat her cholesterol at this time.  Will consider getting lipid panel at next office visit.  4.  As stated above I encouraged her to continue with the intermittent fasting as well as to eat a healthy diet.   Tests ordered No orders of the defined types were placed in this encounter.     No orders of the defined types were placed in this  encounter.   Patient to follow-up in 4 to 5 months for  her annual physical exam, or sooner as needed.  Ailene Ards, NP

## 2020-01-07 ENCOUNTER — Other Ambulatory Visit (INDEPENDENT_AMBULATORY_CARE_PROVIDER_SITE_OTHER): Payer: Self-pay | Admitting: Internal Medicine

## 2020-01-07 ENCOUNTER — Telehealth (INDEPENDENT_AMBULATORY_CARE_PROVIDER_SITE_OTHER): Payer: Self-pay | Admitting: Internal Medicine

## 2020-01-07 MED ORDER — ESCITALOPRAM OXALATE 10 MG PO TABS: 5.0000 mg | ORAL_TABLET | Freq: Every day | ORAL | 1 refills | Status: DC

## 2020-01-07 MED FILL — ESCITALOPRAM 10 MG TABLET: 10 | 90 days supply | Qty: 45 | Fill #0

## 2020-01-08 NOTE — Telephone Encounter (Signed)
Done

## 2020-02-10 LAB — HM PAP SMEAR: HM Pap smear: NORMAL

## 2020-03-12 ENCOUNTER — Other Ambulatory Visit (HOSPITAL_COMMUNITY): Payer: Self-pay | Admitting: General Surgery

## 2020-03-12 DIAGNOSIS — Z1231 Encounter for screening mammogram for malignant neoplasm of breast: Secondary | ICD-10-CM

## 2020-03-17 DIAGNOSIS — Z01419 Encounter for gynecological examination (general) (routine) without abnormal findings: Secondary | ICD-10-CM | POA: Diagnosis not present

## 2020-03-29 ENCOUNTER — Ambulatory Visit (HOSPITAL_COMMUNITY): Payer: 59

## 2020-04-05 ENCOUNTER — Ambulatory Visit (HOSPITAL_COMMUNITY): Admission: RE | Admit: 2020-04-05 | Payer: 59 | Source: Ambulatory Visit

## 2020-04-05 ENCOUNTER — Telehealth (INDEPENDENT_AMBULATORY_CARE_PROVIDER_SITE_OTHER): Payer: Self-pay | Admitting: Internal Medicine

## 2020-04-05 NOTE — Telephone Encounter (Signed)
Pt said she will call her allergist and get the okay, then she will definitely take vaccine.

## 2020-04-05 NOTE — Telephone Encounter (Signed)
Unfortunately, I do not see any of her allergy results from the allergist in epic.  My best advice would be to ask the actual allergist to see if he or she is okay with the patient take it COVID-19 vaccination.  I imagine she is but she can double check with the allergist.

## 2020-04-12 ENCOUNTER — Ambulatory Visit (HOSPITAL_COMMUNITY): Payer: 59

## 2020-04-23 ENCOUNTER — Ambulatory Visit (HOSPITAL_COMMUNITY): Payer: 59

## 2020-04-30 DIAGNOSIS — Z23 Encounter for immunization: Secondary | ICD-10-CM | POA: Diagnosis not present

## 2020-05-07 ENCOUNTER — Ambulatory Visit (HOSPITAL_COMMUNITY): Payer: 59

## 2020-05-19 DIAGNOSIS — G44219 Episodic tension-type headache, not intractable: Secondary | ICD-10-CM | POA: Diagnosis not present

## 2020-05-19 DIAGNOSIS — M9901 Segmental and somatic dysfunction of cervical region: Secondary | ICD-10-CM | POA: Diagnosis not present

## 2020-05-19 DIAGNOSIS — M9902 Segmental and somatic dysfunction of thoracic region: Secondary | ICD-10-CM | POA: Diagnosis not present

## 2020-05-21 ENCOUNTER — Ambulatory Visit (HOSPITAL_COMMUNITY): Payer: 59

## 2020-05-21 DIAGNOSIS — Z23 Encounter for immunization: Secondary | ICD-10-CM | POA: Diagnosis not present

## 2020-05-21 DIAGNOSIS — M9901 Segmental and somatic dysfunction of cervical region: Secondary | ICD-10-CM | POA: Diagnosis not present

## 2020-05-21 DIAGNOSIS — G44219 Episodic tension-type headache, not intractable: Secondary | ICD-10-CM | POA: Diagnosis not present

## 2020-05-21 DIAGNOSIS — M9902 Segmental and somatic dysfunction of thoracic region: Secondary | ICD-10-CM | POA: Diagnosis not present

## 2020-05-26 DIAGNOSIS — G44219 Episodic tension-type headache, not intractable: Secondary | ICD-10-CM | POA: Diagnosis not present

## 2020-05-26 DIAGNOSIS — M9901 Segmental and somatic dysfunction of cervical region: Secondary | ICD-10-CM | POA: Diagnosis not present

## 2020-05-26 DIAGNOSIS — M9902 Segmental and somatic dysfunction of thoracic region: Secondary | ICD-10-CM | POA: Diagnosis not present

## 2020-06-07 ENCOUNTER — Encounter (INDEPENDENT_AMBULATORY_CARE_PROVIDER_SITE_OTHER): Payer: 59 | Admitting: Internal Medicine

## 2020-06-09 DIAGNOSIS — G44219 Episodic tension-type headache, not intractable: Secondary | ICD-10-CM | POA: Diagnosis not present

## 2020-06-09 DIAGNOSIS — M9902 Segmental and somatic dysfunction of thoracic region: Secondary | ICD-10-CM | POA: Diagnosis not present

## 2020-06-09 DIAGNOSIS — M9901 Segmental and somatic dysfunction of cervical region: Secondary | ICD-10-CM | POA: Diagnosis not present

## 2020-06-21 ENCOUNTER — Encounter (INDEPENDENT_AMBULATORY_CARE_PROVIDER_SITE_OTHER): Payer: 59 | Admitting: Internal Medicine

## 2020-06-23 ENCOUNTER — Encounter (INDEPENDENT_AMBULATORY_CARE_PROVIDER_SITE_OTHER): Payer: 59 | Admitting: Nurse Practitioner

## 2020-07-07 DIAGNOSIS — M9902 Segmental and somatic dysfunction of thoracic region: Secondary | ICD-10-CM | POA: Diagnosis not present

## 2020-07-07 DIAGNOSIS — G44219 Episodic tension-type headache, not intractable: Secondary | ICD-10-CM | POA: Diagnosis not present

## 2020-07-07 DIAGNOSIS — M9901 Segmental and somatic dysfunction of cervical region: Secondary | ICD-10-CM | POA: Diagnosis not present

## 2020-07-12 ENCOUNTER — Encounter (INDEPENDENT_AMBULATORY_CARE_PROVIDER_SITE_OTHER): Payer: Self-pay | Admitting: Nurse Practitioner

## 2020-07-12 ENCOUNTER — Other Ambulatory Visit: Payer: Self-pay

## 2020-07-12 ENCOUNTER — Ambulatory Visit (INDEPENDENT_AMBULATORY_CARE_PROVIDER_SITE_OTHER): Payer: 59 | Admitting: Nurse Practitioner

## 2020-07-12 VITALS — BP 112/64 | HR 72 | Temp 97.7°F | Ht 66.0 in | Wt 168.0 lb

## 2020-07-12 DIAGNOSIS — F419 Anxiety disorder, unspecified: Secondary | ICD-10-CM

## 2020-07-12 DIAGNOSIS — Z0001 Encounter for general adult medical examination with abnormal findings: Secondary | ICD-10-CM | POA: Diagnosis not present

## 2020-07-12 DIAGNOSIS — Z131 Encounter for screening for diabetes mellitus: Secondary | ICD-10-CM

## 2020-07-12 DIAGNOSIS — E559 Vitamin D deficiency, unspecified: Secondary | ICD-10-CM | POA: Diagnosis not present

## 2020-07-12 DIAGNOSIS — E782 Mixed hyperlipidemia: Secondary | ICD-10-CM

## 2020-07-12 LAB — CBC WITH DIFFERENTIAL/PLATELET
HCT: 39.9 % (ref 35.0–45.0)
Lymphs Abs: 3081 cells/uL (ref 850–3900)
Platelets: 241 10*3/uL (ref 140–400)

## 2020-07-12 NOTE — Progress Notes (Signed)
Subjective:  Patient ID: Samantha Solis, female    DOB: 01/11/1966  Age: 54 y.o. MRN: [id number]  CC:  Chief Complaint  Patient presents with  . Annual Exam    Doing well      HPI  This patient arrives today for the above.  No acute complaints.  Health maintenance: Due for flu shot and shingles vaccine.  Due for mammogram and colon cancer screening.   Past Medical History:  Diagnosis Date  . Anxiety   . Constipation   . Skin cancer of breast   . Vitamin D deficiency disease 06/04/2019  . Weight gain 01/10/2013      Family History  Problem Relation Age of Onset  . Hypertension Mother   . Stroke Mother   . Aneurysm Mother        brain  . Hypertension Father   . Diabetes Father   . Stroke Father        stroke in eye  . Allergic rhinitis Father   . Eczema Father   . Immunodeficiency Father   . Food Allergy Father   . Hypertension Sister   . Allergic rhinitis Sister   . Diabetes Son   . Allergic rhinitis Son   . Eczema Son   . Heart attack Paternal Grandmother   . Cancer Maternal Grandmother   . Angioedema Neg Hx   . Asthma Neg Hx   . Urticaria Neg Hx     Social History   Social History Narrative  . Not on file   Social History   Tobacco Use  . Smoking status: Never Smoker  . Smokeless tobacco: Never Used  Substance Use Topics  . Alcohol use: No     Current Meds  Medication Sig  . ALPRAZolam (XANAX) 1 MG tablet Take 1 mg by mouth as needed.   Marland Kitchen escitalopram (LEXAPRO) 10 MG tablet Take 0.5 tablets (5 mg total) by mouth daily.  Marland Kitchen VITAMIN D, CHOLECALCIFEROL, PO Take by mouth. Unknown current dose; patient is slowly titration up    ROS:  Review of Systems  Constitutional: Negative.   Respiratory: Negative.   Cardiovascular: Negative.   Neurological: Negative.      Objective:   Today's Vitals: BP 112/64   Pulse 72   Temp 97.7 F (36.5 C) (Temporal)   Ht '5\' 6"'  (1.676 m)   Wt 168 lb (76.2 kg)   LMP 03/04/2013   SpO2 98%   BMI  27.12 kg/m  Vitals with BMI 07/12/2020 12/18/2019 06/04/2019  Height '5\' 6"'  '5\' 7"'  '5\' 7"'   Weight 168 lbs 184 lbs 10 oz 184 lbs  BMI 27.13 38.93 73.42  Systolic 876 811 572  Diastolic 64 75 80  Pulse 72 67 72     Physical Exam Vitals reviewed. Exam conducted with a chaperone present.  Constitutional:      Appearance: Normal appearance.  HENT:     Head: Normocephalic and atraumatic.     Right Ear: Tympanic membrane, ear canal and external ear normal.     Left Ear: Tympanic membrane, ear canal and external ear normal.  Eyes:     General:        Right eye: No discharge.        Left eye: No discharge.     Extraocular Movements: Extraocular movements intact.     Conjunctiva/sclera: Conjunctivae normal.     Pupils: Pupils are equal, round, and reactive to light.  Neck:     Vascular: No carotid  bruit.  Cardiovascular:     Rate and Rhythm: Normal rate and regular rhythm.     Pulses: Normal pulses.     Heart sounds: Normal heart sounds. No murmur heard.   Pulmonary:     Effort: Pulmonary effort is normal.     Breath sounds: Normal breath sounds.  Chest:     Breasts: Breasts are symmetrical.        Right: Normal.        Left: Normal.  Abdominal:     General: Abdomen is flat. Bowel sounds are normal. There is no distension.     Palpations: Abdomen is soft. There is no mass.     Tenderness: There is no abdominal tenderness.  Musculoskeletal:        General: No tenderness.     Cervical back: Neck supple. No muscular tenderness.     Right lower leg: No edema.     Left lower leg: No edema.  Lymphadenopathy:     Cervical: No cervical adenopathy.     Upper Body:     Right upper body: No supraclavicular adenopathy.     Left upper body: No supraclavicular adenopathy.  Skin:    General: Skin is warm and dry.  Neurological:     General: No focal deficit present.     Mental Status: She is alert and oriented to person, place, and time.     Motor: No weakness.     Gait: Gait normal.    Psychiatric:        Mood and Affect: Mood normal.        Behavior: Behavior normal.        Judgment: Judgment normal.          Assessment and Plan   1. Encounter for general adult medical examination with abnormal findings   2. Anxiety   3. Vitamin D deficiency disease   4. Mixed hyperlipidemia   5. Screening for diabetes mellitus      Plan: 1.  Patient will come back later this week for flu shot to be administered.  She will consider getting shingles vaccine at her pharmacy.  Mammogram has been ordered she was encouraged to get this scheduled and completed, she tells me she understands.  She also has a referral from her OB/GYN to a gastroenterologist in [place] to undergo colon cancer screening, and tells me she plans on completing this. 2.-5.  We will collect blood work for further evaluation patient will continue on her current medications as prescribed.   Tests ordered Orders Placed This Encounter  Procedures  . CBC with Differential/Platelets  . CMP with eGFR([facility])  . Lipid Panel  . Hemoglobin A1c  . Vitamin D, 25-hydroxy  . TSH      No orders of the defined types were placed in this encounter.   Patient to follow-up in 6 months or sooner as needed.  Ailene Ards, NP

## 2020-07-13 LAB — COMPLETE METABOLIC PANEL WITH GFR
AG Ratio: 1.5 (calc) (ref 1.0–2.5)
ALT: 11 U/L (ref 6–29)
AST: 21 U/L (ref 10–35)
Albumin: 4.3 g/dL (ref 3.6–5.1)
Alkaline phosphatase (APISO): 61 U/L (ref 37–153)
BUN: 18 mg/dL (ref 7–25)
CO2: 25 mmol/L (ref 20–32)
Calcium: 9.7 mg/dL (ref 8.6–10.4)
Chloride: 105 mmol/L (ref 98–110)
Creat: 0.69 mg/dL (ref 0.50–1.05)
GFR, Est African American: 114 mL/min/{1.73_m2} (ref >=60)
GFR, Est Non African American: 98 mL/min/{1.73_m2} (ref >=60)
Globulin: 2.9 g/dL (calc) (ref 1.9–3.7)
Glucose, Bld: 80 mg/dL (ref 65–99)
Potassium: 5 mmol/L (ref 3.5–5.3)
Sodium: 139 mmol/L (ref 135–146)
Total Bilirubin: 0.5 mg/dL (ref 0.2–1.2)
Total Protein: 7.2 g/dL (ref 6.1–8.1)

## 2020-07-13 LAB — CBC WITH DIFFERENTIAL/PLATELET
Absolute Monocytes: 383 cells/uL (ref 200–950)
Basophils Absolute: 21 cells/uL (ref 0–200)
Basophils Relative: 0.3 %
Eosinophils Absolute: 128 cells/uL (ref 15–500)
Eosinophils Relative: 1.8 %
Hemoglobin: 13.3 g/dL (ref 11.7–15.5)
MCH: 31.5 pg (ref 27.0–33.0)
MCHC: 33.3 g/dL (ref 32.0–36.0)
MCV: 94.5 fL (ref 80.0–100.0)
MPV: 10.7 fL (ref 7.5–12.5)
Monocytes Relative: 5.4 %
Neutro Abs: 3486 cells/uL (ref 1500–7800)
Neutrophils Relative %: 49.1 %
RBC: 4.22 10*6/uL (ref 3.80–5.10)
RDW: 11.8 % (ref 11.0–15.0)
Total Lymphocyte: 43.4 %
WBC: 7.1 10*3/uL (ref 3.8–10.8)

## 2020-07-13 LAB — LIPID PANEL
Cholesterol: 163 mg/dL (ref <200)
HDL: 49 mg/dL — ABNORMAL LOW (ref >=50)
LDL Cholesterol (Calc): 100 mg/dL (calc) — ABNORMAL HIGH
Non-HDL Cholesterol (Calc): 114 mg/dL (calc) (ref <130)
Total CHOL/HDL Ratio: 3.3 (calc) (ref <5.0)
Triglycerides: 59 mg/dL (ref <150)

## 2020-07-13 LAB — HEMOGLOBIN A1C
Hgb A1c MFr Bld: 5.2 % of total Hgb (ref <5.7)
Mean Plasma Glucose: 103 (calc)
eAG (mmol/L): 5.7 (calc)

## 2020-07-13 LAB — TSH: TSH: 1.83 mIU/L

## 2020-07-13 LAB — VITAMIN D 25 HYDROXY (VIT D DEFICIENCY, FRACTURES): Vit D, 25-Hydroxy: 52 ng/mL (ref 30–100)

## 2020-07-20 ENCOUNTER — Ambulatory Visit (INDEPENDENT_AMBULATORY_CARE_PROVIDER_SITE_OTHER): Payer: 59 | Admitting: Internal Medicine

## 2020-07-20 ENCOUNTER — Other Ambulatory Visit: Payer: Self-pay

## 2020-07-20 ENCOUNTER — Encounter (INDEPENDENT_AMBULATORY_CARE_PROVIDER_SITE_OTHER): Payer: Self-pay | Admitting: Internal Medicine

## 2020-07-20 DIAGNOSIS — F419 Anxiety disorder, unspecified: Secondary | ICD-10-CM

## 2020-07-20 NOTE — Progress Notes (Signed)
Error

## 2020-07-28 DIAGNOSIS — G44219 Episodic tension-type headache, not intractable: Secondary | ICD-10-CM | POA: Diagnosis not present

## 2020-07-28 DIAGNOSIS — M9901 Segmental and somatic dysfunction of cervical region: Secondary | ICD-10-CM | POA: Diagnosis not present

## 2020-07-28 DIAGNOSIS — M9902 Segmental and somatic dysfunction of thoracic region: Secondary | ICD-10-CM | POA: Diagnosis not present

## 2020-08-11 DIAGNOSIS — M9901 Segmental and somatic dysfunction of cervical region: Secondary | ICD-10-CM | POA: Diagnosis not present

## 2020-08-11 DIAGNOSIS — G44219 Episodic tension-type headache, not intractable: Secondary | ICD-10-CM | POA: Diagnosis not present

## 2020-08-11 DIAGNOSIS — M9902 Segmental and somatic dysfunction of thoracic region: Secondary | ICD-10-CM | POA: Diagnosis not present

## 2020-09-08 DIAGNOSIS — M9902 Segmental and somatic dysfunction of thoracic region: Secondary | ICD-10-CM | POA: Diagnosis not present

## 2020-09-08 DIAGNOSIS — G44219 Episodic tension-type headache, not intractable: Secondary | ICD-10-CM | POA: Diagnosis not present

## 2020-09-08 DIAGNOSIS — M9901 Segmental and somatic dysfunction of cervical region: Secondary | ICD-10-CM | POA: Diagnosis not present

## 2021-01-14 DIAGNOSIS — M9901 Segmental and somatic dysfunction of cervical region: Secondary | ICD-10-CM | POA: Diagnosis not present

## 2021-01-14 DIAGNOSIS — M5413 Radiculopathy, cervicothoracic region: Secondary | ICD-10-CM | POA: Diagnosis not present

## 2021-01-14 DIAGNOSIS — M546 Pain in thoracic spine: Secondary | ICD-10-CM | POA: Diagnosis not present

## 2021-01-14 DIAGNOSIS — M9902 Segmental and somatic dysfunction of thoracic region: Secondary | ICD-10-CM | POA: Diagnosis not present

## 2021-01-26 DIAGNOSIS — M5413 Radiculopathy, cervicothoracic region: Secondary | ICD-10-CM | POA: Diagnosis not present

## 2021-01-26 DIAGNOSIS — M546 Pain in thoracic spine: Secondary | ICD-10-CM | POA: Diagnosis not present

## 2021-01-26 DIAGNOSIS — M9902 Segmental and somatic dysfunction of thoracic region: Secondary | ICD-10-CM | POA: Diagnosis not present

## 2021-01-26 DIAGNOSIS — M9901 Segmental and somatic dysfunction of cervical region: Secondary | ICD-10-CM | POA: Diagnosis not present

## 2021-03-30 ENCOUNTER — Encounter (INDEPENDENT_AMBULATORY_CARE_PROVIDER_SITE_OTHER): Payer: Self-pay | Admitting: Nurse Practitioner

## 2021-03-30 ENCOUNTER — Other Ambulatory Visit: Payer: Self-pay

## 2021-03-30 ENCOUNTER — Ambulatory Visit (INDEPENDENT_AMBULATORY_CARE_PROVIDER_SITE_OTHER): Payer: 59 | Admitting: Nurse Practitioner

## 2021-03-30 VITALS — BP 110/74 | HR 71 | Temp 97.1°F | Ht 66.0 in | Wt 160.0 lb

## 2021-03-30 DIAGNOSIS — E559 Vitamin D deficiency, unspecified: Secondary | ICD-10-CM

## 2021-03-30 DIAGNOSIS — F419 Anxiety disorder, unspecified: Secondary | ICD-10-CM

## 2021-03-30 DIAGNOSIS — E782 Mixed hyperlipidemia: Secondary | ICD-10-CM | POA: Diagnosis not present

## 2021-03-30 NOTE — Progress Notes (Signed)
Subjective:  Patient ID: Samantha Solis, female    DOB: 08-17-1966  Age: 55 y.o. MRN: [id number]  CC:  Chief Complaint  Patient presents with   Follow-up    Doing well, no concerns   Hyperlipidemia   Other    Vitamin D Deficiency   Anxiety      HPI  This patient arrives today for the above.  Hyperlipidemia: Last lipid panel was collected about 6 months ago LDL was 100.  ASCVD risk at that time was approximately 1.5.  She is not any medication currently for the treatment of her hyperlipidemia.  Vitamin D Deficiency: She continues on vitamin D3 supplement.  Last serum check showed a level of 52.  Anxiety: She is no longer taking Lexapro as she did not feel well on it.  She does take Xanax but very seldomly and only as needed.  Past Medical History:  Diagnosis Date   Anxiety    Constipation    Skin cancer of breast    Vitamin D deficiency disease 06/04/2019   Weight gain 01/10/2013      Family History  Problem Relation Age of Onset   Hypertension Mother    Stroke Mother    Aneurysm Mother        brain   Hypertension Father    Diabetes Father    Stroke Father        stroke in eye   Allergic rhinitis Father    Eczema Father    Immunodeficiency Father    Food Allergy Father    Hypertension Sister    Allergic rhinitis Sister    Diabetes Son    Allergic rhinitis Son    Eczema Son    Heart attack Paternal Grandmother    Cancer Maternal Grandmother    Angioedema Neg Hx    Asthma Neg Hx    Urticaria Neg Hx     Social History   Social History Narrative   Not on file   Social History   Tobacco Use   Smoking status: Never   Smokeless tobacco: Never  Substance Use Topics   Alcohol use: No     Current Meds  Medication Sig   ALPRAZolam (XANAX) 1 MG tablet Take 1 mg by mouth as needed.    doxycycline (VIBRA-TABS) 100 MG tablet Take 100 mg by mouth 2 (two) times daily.   VITAMIN D, CHOLECALCIFEROL, PO Take 5,000 Int'l Units/L by mouth daily.     ROS:  Review of Systems  Eyes:  Negative for blurred vision.  Respiratory:  Negative for shortness of breath.   Cardiovascular:  Negative for chest pain.  Gastrointestinal:  Negative for abdominal pain and blood in stool.  Neurological:  Negative for dizziness and headaches.    Objective:   Today's Vitals: BP 110/74   Pulse 71   Temp (!) 97.1 F (36.2 C) (Temporal)   Ht 5\' 6"  (1.676 m)   Wt 160 lb (72.6 kg)   LMP 03/04/2013   SpO2 96%   BMI 25.82 kg/m  Vitals with BMI 03/30/2021 07/20/2020 07/12/2020  Height 5\' 6"  (No Data) 5\' 6"   Weight 160 lbs (No Data) 168 lbs  BMI 77.82 - 42.35  Systolic 361 (No Data) 443  Diastolic 74 (No Data) 64  Pulse 71 - 72     Physical Exam Vitals reviewed.  Constitutional:      General: She is not in acute distress.    Appearance: Normal appearance.  HENT:  Head: Normocephalic and atraumatic.  Cardiovascular:     Rate and Rhythm: Normal rate and regular rhythm.     Pulses: Normal pulses.     Heart sounds: Normal heart sounds.  Pulmonary:     Effort: Pulmonary effort is normal.     Breath sounds: Normal breath sounds.  Skin:    General: Skin is warm and dry.  Neurological:     General: No focal deficit present.     Mental Status: She is alert and oriented to person, place, and time.  Psychiatric:        Mood and Affect: Mood normal.        Behavior: Behavior normal.        Judgment: Judgment normal.         Assessment and Plan   1. Mixed hyperlipidemia   2. Vitamin D deficiency disease   3. Anxiety      Plan: 1.  We will consider checking lipid panel next office visit.  For now she is encouraged to focus on following a healthy diet and I have recommended considering starting Mediterranean diet.  She is wondering if she could eat a higher protein based diet and I think this is fine as well but I recommend she avoid high intake of red meat or processed meats or processed carbohydrates and she is agreeable to avoid  these food products. 2.  She will continue taking her vitamin D3 supplement we will check serum level at next office visit. 3.  Appears to be well controlled with only as needed Xanax.  Should continue on this medication as currently prescribed.   Tests ordered No orders of the defined types were placed in this encounter.     No orders of the defined types were placed in this encounter.   Patient to follow-up in 6 months or sooner as needed.  Ailene Ards, NP

## 2021-06-30 ENCOUNTER — Ambulatory Visit: Payer: 59 | Admitting: Internal Medicine

## 2021-07-04 ENCOUNTER — Encounter: Payer: Self-pay | Admitting: Internal Medicine

## 2021-07-04 ENCOUNTER — Ambulatory Visit: Payer: 59 | Admitting: Internal Medicine

## 2021-07-04 ENCOUNTER — Other Ambulatory Visit: Payer: Self-pay

## 2021-07-04 VITALS — BP 137/82 | HR 80 | Temp 98.0°F | Ht 67.0 in | Wt 167.0 lb

## 2021-07-04 DIAGNOSIS — Z0001 Encounter for general adult medical examination with abnormal findings: Secondary | ICD-10-CM | POA: Diagnosis not present

## 2021-07-04 DIAGNOSIS — E559 Vitamin D deficiency, unspecified: Secondary | ICD-10-CM

## 2021-07-04 DIAGNOSIS — R635 Abnormal weight gain: Secondary | ICD-10-CM

## 2021-07-04 DIAGNOSIS — F411 Generalized anxiety disorder: Secondary | ICD-10-CM

## 2021-07-04 DIAGNOSIS — Z114 Encounter for screening for human immunodeficiency virus [HIV]: Secondary | ICD-10-CM | POA: Diagnosis not present

## 2021-07-04 DIAGNOSIS — Z7689 Persons encountering health services in other specified circumstances: Secondary | ICD-10-CM | POA: Diagnosis not present

## 2021-07-04 DIAGNOSIS — Z1211 Encounter for screening for malignant neoplasm of colon: Secondary | ICD-10-CM

## 2021-07-04 NOTE — Patient Instructions (Signed)
Please maintain simple sleep hygiene. - Maintain dark and non-noisy environment in the bedroom. - Please use the bedroom for sleep and sexual activity only. - Do not use electronic devices in the bedroom. - Please take dinner at least 2 hours before bedtime. - Please avoid caffeinated products in the evening, including coffee, soft drinks. - Please try to maintain the regular sleep-wake cycle - Go to bed and wake up at the same time.  Okay to take Melatonin 5 mg at nighttime to help with sleep.  Please get fasting blood tests done within a week.

## 2021-07-04 NOTE — Assessment & Plan Note (Signed)
Care established Previous chart reviewed History and medications reviewed with the patient 

## 2021-07-04 NOTE — Progress Notes (Signed)
New Patient Office Visit  Subjective:  Patient ID: Samantha Solis, female    DOB: [birth year]  Age: 55 y.o. MRN: [id number]  CC:  Chief Complaint  Patient presents with   New Patient (Initial Visit)    New pt. Previous PCP Dr. Anastasio Solis last seen September. Would like to discuss anxiety and weight. Was on Optiva for 8 months and lost 30lbs. Has been off it and has gained 14lbs.     HPI Samantha Solis is a 55 year old female with PMH of GAD who presents for establishing care.  She had Xanax in the past for anxiety spells, but has not been taking it lately. She denies to take any medicines currently. She states that she has anxiety spells with stress at work or home.  She has insomnia on the days of work, but denies any sleep problems over the weekend.  Denies any anhedonia, SI or HI.  She recently had dental work-up and has been taking doxycycline for a possible dental abscess.  She had lost about 30 lbs with Optivia diet program, but has gained about 14 lbs since the completion of the program.  She is concerned as her family members are overweight.  She follows OB/GYN for Pap smear and mammography.  Last Pap smear was in 2020.  She has had 2 doses of COVID-vaccine.  She has egg allergy, so requires specific flu vaccine.  Past Medical History:  Diagnosis Date   Anxiety    Constipation    Skin cancer of breast    Vitamin D deficiency disease 06/04/2019   Weight gain 01/10/2013    Past Surgical History:  Procedure Laterality Date   CESAREAN SECTION     CHOLECYSTECTOMY     MOLE REMOVAL Left    SKIN CANCER EXCISION      Family History  Problem Relation Age of Onset   Hypertension Mother    Stroke Mother    Aneurysm Mother        brain   Hypertension Father    Diabetes Father    Stroke Father        stroke in eye   Allergic rhinitis Father    Eczema Father    Immunodeficiency Father    Food Allergy Father    Hypertension Sister    Allergic rhinitis Sister    Diabetes  Son    Allergic rhinitis Son    Eczema Son    Heart attack Paternal Grandmother    Cancer Maternal Grandmother    Angioedema Neg Hx    Asthma Neg Hx    Urticaria Neg Hx     Social History   Socioeconomic History   Marital status: Married    Spouse name: Not on file   Number of children: Not on file   Years of education: Not on file   Highest education level: Not on file  Occupational History   Not on file  Tobacco Use   Smoking status: Never   Smokeless tobacco: Never  Vaping Use   Vaping Use: Never used  Substance and Sexual Activity   Alcohol use: No   Drug use: No   Sexual activity: Yes    Birth control/protection: None    Comment: vasectomy  Other Topics Concern   Not on file  Social History Narrative   Not on file   Social Determinants of Health   Financial Resource Strain: Not on file  Food Insecurity: Not on file  Transportation Needs: Not on file  Physical  Activity: Not on file  Stress: Not on file  Social Connections: Not on file  Intimate Partner Violence: Not on file    ROS Review of Systems  Constitutional:  Negative for chills and fever.  HENT:  Negative for congestion, sinus pressure, sinus pain and sore throat.   Eyes:  Negative for pain and discharge.  Respiratory:  Negative for cough and shortness of breath.   Cardiovascular:  Negative for chest pain and palpitations.  Gastrointestinal:  Negative for abdominal pain, constipation, diarrhea, nausea and vomiting.  Endocrine: Negative for polydipsia and polyuria.  Genitourinary:  Negative for dysuria and hematuria.  Musculoskeletal:  Negative for neck pain and neck stiffness.  Skin:  Negative for rash.  Neurological:  Negative for dizziness and weakness.  Psychiatric/Behavioral:  Positive for sleep disturbance. Negative for agitation and behavioral problems. The patient is nervous/anxious.    Objective:   Today's Vitals: BP 137/82 (BP Location: Left Arm, Patient Position: Sitting, Cuff  Size: Normal)   Pulse 80   Temp 98 F (36.7 C) (Oral)   Ht '5\' 7"'  (1.702 m)   Wt 167 lb (75.8 kg)   LMP 03/04/2013   SpO2 99%   BMI 26.16 kg/m   Physical Exam Vitals reviewed.  Constitutional:      General: She is not in acute distress.    Appearance: She is not diaphoretic.  HENT:     Head: Normocephalic and atraumatic.     Nose: Nose normal.     Mouth/Throat:     Mouth: Mucous membranes are moist.  Eyes:     General: No scleral icterus.    Extraocular Movements: Extraocular movements intact.  Cardiovascular:     Rate and Rhythm: Normal rate and regular rhythm.     Pulses: Normal pulses.     Heart sounds: Normal heart sounds. No murmur heard. Pulmonary:     Breath sounds: Normal breath sounds. No wheezing or rales.  Musculoskeletal:     Cervical back: Neck supple. No tenderness.     Right lower leg: No edema.     Left lower leg: No edema.  Skin:    General: Skin is warm.     Findings: No rash.  Neurological:     General: No focal deficit present.     Mental Status: She is alert and oriented to person, place, and time.  Psychiatric:        Mood and Affect: Mood normal.        Behavior: Behavior normal.    Assessment & Plan:   Problem List Items Addressed This Visit       Other   GAD (generalized anxiety disorder)    Has Xanax 1 mg PRN, has not taken for a long time Does not prefer any medicine Relaxation techniques material provided Sleep hygiene discussed      Weight gain    Had lost 30 lbs with Optavia diet program, but has gained about 14 lbs since completion BMI around 26 now, advised to follow low carb plant based diet for now and perform moderate exercise/walking at least 150 mins/week      Encounter to establish care - Primary    Care established Previous chart reviewed History and medications reviewed with the patient       Other Visit Diagnoses     Colon cancer screening       Relevant Orders   Cologuard   Encounter for screening for  HIV       Relevant Orders  HIV antibody (with reflex)   Vitamin D deficiency       Relevant Orders   Vitamin D (25 hydroxy)   Encounter for general adult medical examination with abnormal findings       Relevant Orders   CBC with Differential/Platelet   CMP14+EGFR   Lipid panel   HgB A1c   TSH       Outpatient Encounter Medications as of 07/04/2021  Medication Sig   doxycycline (VIBRA-TABS) 100 MG tablet Take 100 mg by mouth 2 (two) times daily.   ALPRAZolam (XANAX) 1 MG tablet Take 1 mg by mouth as needed.  (Patient not taking: Reported on 07/04/2021)   [DISCONTINUED] VITAMIN D, CHOLECALCIFEROL, PO Take 5,000 Int'l Units/L by mouth daily.   No facility-administered encounter medications on file as of 07/04/2021.    Follow-up: Return in about 1 year (around 07/04/2022) for Annual physical.   Lindell Spar, MD

## 2021-07-04 NOTE — Assessment & Plan Note (Signed)
Has Xanax 1 mg PRN, has not taken for a long time Does not prefer any medicine Relaxation techniques material provided Sleep hygiene discussed

## 2021-07-04 NOTE — Assessment & Plan Note (Signed)
Had lost 30 lbs with Optavia diet program, but has gained about 14 lbs since completion BMI around 26 now, advised to follow low carb plant based diet for now and perform moderate exercise/walking at least 150 mins/week

## 2021-07-18 DIAGNOSIS — M9902 Segmental and somatic dysfunction of thoracic region: Secondary | ICD-10-CM | POA: Diagnosis not present

## 2021-07-18 DIAGNOSIS — M9901 Segmental and somatic dysfunction of cervical region: Secondary | ICD-10-CM | POA: Diagnosis not present

## 2021-07-18 DIAGNOSIS — M542 Cervicalgia: Secondary | ICD-10-CM | POA: Diagnosis not present

## 2021-07-18 DIAGNOSIS — M546 Pain in thoracic spine: Secondary | ICD-10-CM | POA: Diagnosis not present

## 2021-08-15 DIAGNOSIS — M546 Pain in thoracic spine: Secondary | ICD-10-CM | POA: Diagnosis not present

## 2021-08-15 DIAGNOSIS — M9901 Segmental and somatic dysfunction of cervical region: Secondary | ICD-10-CM | POA: Diagnosis not present

## 2021-08-15 DIAGNOSIS — M542 Cervicalgia: Secondary | ICD-10-CM | POA: Diagnosis not present

## 2021-08-15 DIAGNOSIS — M9902 Segmental and somatic dysfunction of thoracic region: Secondary | ICD-10-CM | POA: Diagnosis not present

## 2021-10-03 ENCOUNTER — Ambulatory Visit (INDEPENDENT_AMBULATORY_CARE_PROVIDER_SITE_OTHER): Payer: 59 | Admitting: Internal Medicine

## 2021-10-25 ENCOUNTER — Other Ambulatory Visit (HOSPITAL_COMMUNITY): Payer: Self-pay

## 2021-10-25 MED ORDER — ESCITALOPRAM OXALATE 10 MG PO TABS
ORAL_TABLET | ORAL | 0 refills | Status: DC
  Filled 2021-10-25: qty 30, 30d supply, fill #0

## 2021-10-28 ENCOUNTER — Other Ambulatory Visit (HOSPITAL_COMMUNITY): Payer: Self-pay

## 2021-12-01 ENCOUNTER — Other Ambulatory Visit (INDEPENDENT_AMBULATORY_CARE_PROVIDER_SITE_OTHER): Payer: 59 | Admitting: General Surgery

## 2021-12-01 DIAGNOSIS — Z09 Encounter for follow-up examination after completed treatment for conditions other than malignant neoplasm: Secondary | ICD-10-CM

## 2021-12-01 MED ORDER — DOXYCYCLINE HYCLATE 100 MG PO TABS: 100.0000 mg | ORAL_TABLET | Freq: Two times a day (BID) | ORAL | 1 refills | Status: DC

## 2021-12-01 NOTE — Progress Notes (Signed)
Refill

## 2021-12-02 DIAGNOSIS — F4310 Post-traumatic stress disorder, unspecified: Secondary | ICD-10-CM | POA: Diagnosis not present

## 2021-12-02 DIAGNOSIS — L309 Dermatitis, unspecified: Secondary | ICD-10-CM | POA: Insufficient documentation

## 2021-12-02 DIAGNOSIS — E663 Overweight: Secondary | ICD-10-CM | POA: Diagnosis not present

## 2021-12-02 DIAGNOSIS — F5101 Primary insomnia: Secondary | ICD-10-CM | POA: Diagnosis not present

## 2021-12-02 DIAGNOSIS — K047 Periapical abscess without sinus: Secondary | ICD-10-CM | POA: Diagnosis not present

## 2021-12-23 ENCOUNTER — Other Ambulatory Visit (HOSPITAL_COMMUNITY): Payer: Self-pay | Admitting: Obstetrics and Gynecology

## 2021-12-23 ENCOUNTER — Other Ambulatory Visit (HOSPITAL_COMMUNITY): Payer: Self-pay | Admitting: Internal Medicine

## 2021-12-23 DIAGNOSIS — Z1231 Encounter for screening mammogram for malignant neoplasm of breast: Secondary | ICD-10-CM

## 2022-01-02 ENCOUNTER — Ambulatory Visit (HOSPITAL_COMMUNITY): Payer: 59

## 2022-01-04 ENCOUNTER — Ambulatory Visit (HOSPITAL_COMMUNITY)
Admission: RE | Admit: 2022-01-04 | Discharge: 2022-01-04 | Disposition: A | Discharge status: Home / Self Care | Payer: 59 | Source: Ambulatory Visit | Attending: Obstetrics and Gynecology | Admitting: Obstetrics and Gynecology

## 2022-01-04 DIAGNOSIS — Z1231 Encounter for screening mammogram for malignant neoplasm of breast: Secondary | ICD-10-CM | POA: Diagnosis not present

## 2022-02-01 DIAGNOSIS — Z01419 Encounter for gynecological examination (general) (routine) without abnormal findings: Secondary | ICD-10-CM | POA: Diagnosis not present

## 2022-02-01 DIAGNOSIS — Z0000 Encounter for general adult medical examination without abnormal findings: Secondary | ICD-10-CM | POA: Diagnosis not present

## 2022-02-03 DIAGNOSIS — Z0000 Encounter for general adult medical examination without abnormal findings: Secondary | ICD-10-CM | POA: Diagnosis not present

## 2022-02-03 DIAGNOSIS — F5101 Primary insomnia: Secondary | ICD-10-CM | POA: Diagnosis not present

## 2022-02-03 DIAGNOSIS — Z1211 Encounter for screening for malignant neoplasm of colon: Secondary | ICD-10-CM | POA: Diagnosis not present

## 2022-02-03 DIAGNOSIS — H269 Unspecified cataract: Secondary | ICD-10-CM | POA: Insufficient documentation

## 2022-03-01 ENCOUNTER — Ambulatory Visit: Payer: 59 | Admitting: Physician Assistant

## 2022-03-01 ENCOUNTER — Encounter: Payer: Self-pay | Admitting: Physician Assistant

## 2022-03-01 DIAGNOSIS — Z86018 Personal history of other benign neoplasm: Secondary | ICD-10-CM

## 2022-03-01 DIAGNOSIS — Z1283 Encounter for screening for malignant neoplasm of skin: Secondary | ICD-10-CM | POA: Diagnosis not present

## 2022-03-01 DIAGNOSIS — Z85828 Personal history of other malignant neoplasm of skin: Secondary | ICD-10-CM

## 2022-03-01 NOTE — Patient Instructions (Signed)
[facility]

## 2022-03-01 NOTE — Progress Notes (Signed)
   New Patient   Subjective  Samantha Solis is a 56 y.o. female who presents for the following: Annual Exam (Skin check, personal history of atypia and scc on the breast).   The following portions of the chart were reviewed this encounter and updated as appropriate:  Tobacco  Allergies  Meds  Problems  Med Hx  Surg Hx  Fam Hx      Objective  Well appearing patient in no apparent distress; mood and affect are within normal limits.  A full examination was performed including scalp, head, eyes, ears, nose, lips, neck, chest, axillae, abdomen, back, buttocks, bilateral upper extremities, bilateral lower extremities, hands, feet, fingers, toes, fingernails, and toenails. All findings within normal limits unless otherwise noted below.  Head to toe No atypical nevi or signs of NMSC noted at the time of the visit.    Assessment & Plan  Skin exam for malignant neoplasm Head to toe  Yearly skin check     I, Anab Vivar,Tyriana, PA-C, have reviewed all documentation's for this visit.  The documentation on 03/01/22 for the exam, diagnosis, procedures and orders are all accurate and complete.

## 2022-03-28 DIAGNOSIS — E663 Overweight: Secondary | ICD-10-CM | POA: Diagnosis not present

## 2022-03-28 DIAGNOSIS — F418 Other specified anxiety disorders: Secondary | ICD-10-CM | POA: Diagnosis not present

## 2022-03-28 DIAGNOSIS — R0602 Shortness of breath: Secondary | ICD-10-CM | POA: Diagnosis not present

## 2022-03-28 DIAGNOSIS — E7800 Pure hypercholesterolemia, unspecified: Secondary | ICD-10-CM | POA: Diagnosis not present

## 2022-03-28 DIAGNOSIS — R5383 Other fatigue: Secondary | ICD-10-CM | POA: Diagnosis not present

## 2022-03-28 DIAGNOSIS — G4719 Other hypersomnia: Secondary | ICD-10-CM | POA: Diagnosis not present

## 2022-03-28 DIAGNOSIS — R632 Polyphagia: Secondary | ICD-10-CM | POA: Diagnosis not present

## 2022-04-11 DIAGNOSIS — E7800 Pure hypercholesterolemia, unspecified: Secondary | ICD-10-CM | POA: Diagnosis not present

## 2022-04-11 DIAGNOSIS — E663 Overweight: Secondary | ICD-10-CM | POA: Diagnosis not present

## 2022-04-11 DIAGNOSIS — R948 Abnormal results of function studies of other organs and systems: Secondary | ICD-10-CM | POA: Diagnosis not present

## 2022-04-11 DIAGNOSIS — F909 Attention-deficit hyperactivity disorder, unspecified type: Secondary | ICD-10-CM | POA: Diagnosis not present

## 2022-05-17 DIAGNOSIS — F5101 Primary insomnia: Secondary | ICD-10-CM | POA: Diagnosis not present

## 2022-05-17 DIAGNOSIS — E7800 Pure hypercholesterolemia, unspecified: Secondary | ICD-10-CM | POA: Diagnosis not present

## 2022-05-17 DIAGNOSIS — F418 Other specified anxiety disorders: Secondary | ICD-10-CM | POA: Diagnosis not present

## 2022-05-17 DIAGNOSIS — E663 Overweight: Secondary | ICD-10-CM | POA: Diagnosis not present

## 2022-05-17 DIAGNOSIS — Z6827 Body mass index (BMI) 27.0-27.9, adult: Secondary | ICD-10-CM | POA: Diagnosis not present

## 2022-06-06 ENCOUNTER — Other Ambulatory Visit: Payer: Self-pay | Admitting: General Surgery

## 2022-06-21 DIAGNOSIS — G4700 Insomnia, unspecified: Secondary | ICD-10-CM | POA: Diagnosis not present

## 2022-06-21 DIAGNOSIS — Z6826 Body mass index (BMI) 26.0-26.9, adult: Secondary | ICD-10-CM | POA: Diagnosis not present

## 2022-06-21 DIAGNOSIS — E663 Overweight: Secondary | ICD-10-CM | POA: Diagnosis not present

## 2022-06-21 DIAGNOSIS — F418 Other specified anxiety disorders: Secondary | ICD-10-CM | POA: Diagnosis not present

## 2022-06-21 DIAGNOSIS — R632 Polyphagia: Secondary | ICD-10-CM | POA: Diagnosis not present

## 2022-06-21 DIAGNOSIS — K5904 Chronic idiopathic constipation: Secondary | ICD-10-CM | POA: Diagnosis not present

## 2022-07-10 ENCOUNTER — Encounter: Payer: 59 | Admitting: Internal Medicine

## 2022-08-07 DIAGNOSIS — Z6825 Body mass index (BMI) 25.0-25.9, adult: Secondary | ICD-10-CM | POA: Diagnosis not present

## 2022-08-07 DIAGNOSIS — K5904 Chronic idiopathic constipation: Secondary | ICD-10-CM | POA: Diagnosis not present

## 2022-08-07 DIAGNOSIS — E663 Overweight: Secondary | ICD-10-CM | POA: Diagnosis not present

## 2022-08-07 DIAGNOSIS — G4709 Other insomnia: Secondary | ICD-10-CM | POA: Diagnosis not present

## 2022-08-07 DIAGNOSIS — R632 Polyphagia: Secondary | ICD-10-CM | POA: Diagnosis not present

## 2022-09-06 ENCOUNTER — Other Ambulatory Visit (HOSPITAL_COMMUNITY): Payer: Self-pay

## 2022-09-06 DIAGNOSIS — Z6825 Body mass index (BMI) 25.0-25.9, adult: Secondary | ICD-10-CM | POA: Diagnosis not present

## 2022-09-06 DIAGNOSIS — F909 Attention-deficit hyperactivity disorder, unspecified type: Secondary | ICD-10-CM | POA: Diagnosis not present

## 2022-09-06 DIAGNOSIS — E663 Overweight: Secondary | ICD-10-CM | POA: Diagnosis not present

## 2022-09-06 DIAGNOSIS — K5904 Chronic idiopathic constipation: Secondary | ICD-10-CM | POA: Diagnosis not present

## 2022-09-06 MED ORDER — LINZESS 290 MCG PO CAPS
290.0000 ug | ORAL_CAPSULE | ORAL | 1 refills | Status: DC
  Filled 2022-09-06 – 2022-09-07 (×2): qty 90, 90d supply, fill #0
  Filled 2023-04-20 – 2023-06-06 (×3): qty 90, 90d supply, fill #1

## 2022-09-06 MED ORDER — LISDEXAMFETAMINE DIMESYLATE 30 MG PO CAPS
30.0000 mg | ORAL_CAPSULE | Freq: Every morning | ORAL | 0 refills | Status: DC
  Filled 2022-09-06 – 2022-09-07 (×2): qty 30, 30d supply, fill #0

## 2022-09-07 ENCOUNTER — Other Ambulatory Visit (HOSPITAL_COMMUNITY): Payer: Self-pay

## 2022-09-18 ENCOUNTER — Encounter: Payer: 59 | Admitting: Internal Medicine

## 2022-10-04 ENCOUNTER — Other Ambulatory Visit (HOSPITAL_COMMUNITY): Payer: Self-pay

## 2022-10-04 DIAGNOSIS — E663 Overweight: Secondary | ICD-10-CM | POA: Diagnosis not present

## 2022-10-04 DIAGNOSIS — F909 Attention-deficit hyperactivity disorder, unspecified type: Secondary | ICD-10-CM | POA: Diagnosis not present

## 2022-10-04 DIAGNOSIS — Z6825 Body mass index (BMI) 25.0-25.9, adult: Secondary | ICD-10-CM | POA: Diagnosis not present

## 2022-10-04 DIAGNOSIS — K5904 Chronic idiopathic constipation: Secondary | ICD-10-CM | POA: Diagnosis not present

## 2022-10-04 MED ORDER — LISDEXAMFETAMINE DIMESYLATE 20 MG PO CAPS
20.0000 mg | ORAL_CAPSULE | Freq: Every day | ORAL | 0 refills | Status: DC
  Filled 2022-10-04: qty 30, 30d supply, fill #0

## 2022-10-04 MED ORDER — LISDEXAMFETAMINE DIMESYLATE 40 MG PO CAPS
40.0000 mg | ORAL_CAPSULE | Freq: Every morning | ORAL | 0 refills | Status: DC
  Filled 2022-10-04: qty 30, 30d supply, fill #0

## 2022-10-16 ENCOUNTER — Encounter: Payer: Self-pay | Admitting: Internal Medicine

## 2022-10-16 ENCOUNTER — Ambulatory Visit (INDEPENDENT_AMBULATORY_CARE_PROVIDER_SITE_OTHER): Payer: Commercial Managed Care - PPO | Admitting: Internal Medicine

## 2022-10-16 VITALS — BP 121/78 | HR 94 | Ht 67.0 in | Wt 165.6 lb

## 2022-10-16 DIAGNOSIS — Z0001 Encounter for general adult medical examination with abnormal findings: Secondary | ICD-10-CM

## 2022-10-16 DIAGNOSIS — Z1159 Encounter for screening for other viral diseases: Secondary | ICD-10-CM | POA: Diagnosis not present

## 2022-10-16 DIAGNOSIS — Z1211 Encounter for screening for malignant neoplasm of colon: Secondary | ICD-10-CM

## 2022-10-16 DIAGNOSIS — F411 Generalized anxiety disorder: Secondary | ICD-10-CM | POA: Diagnosis not present

## 2022-10-16 DIAGNOSIS — R635 Abnormal weight gain: Secondary | ICD-10-CM

## 2022-10-16 DIAGNOSIS — E782 Mixed hyperlipidemia: Secondary | ICD-10-CM | POA: Diagnosis not present

## 2022-10-16 DIAGNOSIS — Z1212 Encounter for screening for malignant neoplasm of rectum: Secondary | ICD-10-CM | POA: Diagnosis not present

## 2022-10-16 NOTE — Progress Notes (Unsigned)
Complete physical exam  Patient: Samantha Solis   DOB: [birth year]   57 y.o. Female  MRN: [id number]  Subjective:    Chief Complaint  Patient presents with   Annual Exam   Samantha Solis is a 57 y.o. female who presents today for a complete physical exam. She reports consuming a general diet. The patient does not participate in regular exercise at present. She generally feels well. She reports sleeping well. She does not have additional problems to discuss today.   Most recent fall risk assessment:    10/16/2022    8:49 AM  [place] in the past year? 0  Number falls in past yr: 0  Injury with Fall? 0  Risk for fall due to : No Fall Risks  Follow up Falls evaluation completed   Most recent depression screenings:    10/16/2022    8:49 AM 07/04/2021    4:17 PM  PHQ 2/9 Scores  PHQ - 2 Score 0 0  PHQ- 9 Score 5    Vision:Within last year and Dental: No current dental problems and Receives regular dental care  Past Medical History:  Diagnosis Date   Anxiety    Atypical mole    Constipation    Skin cancer of breast    Squamous cell carcinoma of skin    Vitamin D deficiency disease 06/04/2019   Weight gain 01/10/2013   Past Surgical History:  Procedure Laterality Date   CESAREAN SECTION     CHOLECYSTECTOMY     MOLE REMOVAL Left    SKIN CANCER EXCISION     Social History   Tobacco Use   Smoking status: Never   Smokeless tobacco: Never  Vaping Use   Vaping Use: Never used  Substance Use Topics   Alcohol use: No   Drug use: No   Family History  Problem Relation Age of Onset   Hypertension Mother    Stroke Mother    Aneurysm Mother        brain   Hypertension Father    Diabetes Father    Stroke Father        stroke in eye   Allergic rhinitis Father    Eczema Father    Immunodeficiency Father    Food Allergy Father    Hypertension Sister    Allergic rhinitis Sister    Diabetes Son    Allergic rhinitis Son    Eczema Son    Heart attack  Paternal Grandmother    Cancer Maternal Grandmother    Angioedema Neg Hx    Asthma Neg Hx    Urticaria Neg Hx    Allergies  Allergen Reactions   Citrullus Vulgaris Anaphylaxis   Peanut-Containing Drug Products Rash   Fish Allergy    Patient Care Team: Johnette Abraham, MD as PCP - General (Internal Medicine) Warren Danes, PA-C as Physician Assistant (Dermatology)   Outpatient Medications Prior to Visit  Medication Sig   ALPRAZolam (XANAX) 1 MG tablet Take 1 mg by mouth as needed.   doxycycline (VIBRA-TABS) 100 MG tablet TAKE ONE TABLET BY MOUTH TWICE DAILY   linaclotide (LINZESS) 290 MCG CAPS capsule Take 1 capsule (290 mcg total) by mouth daily 30 minutes before first meal of the day on an empty stomach.   lisdexamfetamine (VYVANSE) 20 MG capsule Take 1 capsule (20 mg total) by mouth daily at lunch   lisdexamfetamine (VYVANSE) 40 MG capsule Take 1 capsule (40 mg total) by mouth every  morning.   traZODone (DESYREL) 50 MG tablet Take 50 mg by mouth at bedtime. 1 or 2   triamcinolone cream (KENALOG) 0.1 % Apply topically 2 (two) times daily.   [DISCONTINUED] escitalopram (LEXAPRO) 10 MG tablet Take 1 tablet by mouth daily. (Patient not taking: Reported on 03/01/2022)   [DISCONTINUED] lisdexamfetamine (VYVANSE) 30 MG capsule Take 1 capsule (30 mg total) by mouth once daily in the morning.   No facility-administered medications prior to visit.   Review of Systems  Constitutional:  Negative for chills and fever.  HENT:  Negative for sore throat.   Respiratory:  Negative for cough and shortness of breath.   Cardiovascular:  Negative for chest pain, palpitations and leg swelling.  Gastrointestinal:  Negative for abdominal pain, blood in stool, constipation, diarrhea, nausea and vomiting.  Genitourinary:  Negative for dysuria and hematuria.  Musculoskeletal:  Negative for myalgias.  Skin:  Negative for itching and rash.  Neurological:  Negative for dizziness and headaches.   Psychiatric/Behavioral:  Negative for depression and suicidal ideas.       Objective:     BP 121/78   Pulse 94   Ht '5\' 7"'$  (1.702 m)   Wt 165 lb 9.6 oz (75.1 kg)   LMP 03/04/2013   SpO2 98%   BMI 25.94 kg/m  BP Readings from Last 3 Encounters:  10/16/22 121/78  07/04/21 137/82  03/30/21 110/74   Physical Exam Constitutional:      General: She is not in acute distress.    Appearance: Normal appearance. She is not toxic-appearing.  HENT:     Head: Normocephalic and atraumatic.     Right Ear: External ear normal.     Left Ear: External ear normal.     Nose: Nose normal. No congestion or rhinorrhea.     Mouth/Throat:     Mouth: Mucous membranes are moist.     Pharynx: Oropharynx is clear. No oropharyngeal exudate or posterior oropharyngeal erythema.  Eyes:     General: No scleral icterus.    Extraocular Movements: Extraocular movements intact.     Conjunctiva/sclera: Conjunctivae normal.     Pupils: Pupils are equal, round, and reactive to light.  Cardiovascular:     Rate and Rhythm: Normal rate and regular rhythm.     Pulses: Normal pulses.     Heart sounds: Normal heart sounds. No murmur heard.    No friction rub. No gallop.  Pulmonary:     Effort: Pulmonary effort is normal.     Breath sounds: Normal breath sounds. No wheezing, rhonchi or rales.  Abdominal:     General: Abdomen is flat. Bowel sounds are normal. There is no distension.     Palpations: Abdomen is soft.     Tenderness: There is no abdominal tenderness.  Musculoskeletal:        General: No swelling. Normal range of motion.     Cervical back: Normal range of motion.     Right lower leg: No edema.     Left lower leg: No edema.  Lymphadenopathy:     Cervical: No cervical adenopathy.  Skin:    General: Skin is warm and dry.     Capillary Refill: Capillary refill takes less than 2 seconds.     Coloration: Skin is not jaundiced.  Neurological:     General: No focal deficit present.     Mental  Status: She is alert and oriented to person, place, and time.  Psychiatric:        Mood  and Affect: Mood normal.        Behavior: Behavior normal.   Last CBC Lab Results  Component Value Date   WBC 7.1 07/12/2020   HGB 13.3 07/12/2020   HCT 39.9 07/12/2020   MCV 94.5 07/12/2020   MCH 31.5 07/12/2020   RDW 11.8 07/12/2020   PLT 241 41/96/2229   Last metabolic panel Lab Results  Component Value Date   GLUCOSE 80 07/12/2020   NA 139 07/12/2020   K 5.0 07/12/2020   CL 105 07/12/2020   CO2 25 07/12/2020   BUN 18 07/12/2020   CREATININE 0.69 07/12/2020   GFRNONAA 98 07/12/2020   CALCIUM 9.7 07/12/2020   PROT 7.2 07/12/2020   BILITOT 0.5 07/12/2020   AST 21 07/12/2020   ALT 11 07/12/2020   Last lipids Lab Results  Component Value Date   CHOL 163 07/12/2020   HDL 49 (L) 07/12/2020   LDLCALC 100 (H) 07/12/2020   TRIG 59 07/12/2020   CHOLHDL 3.3 07/12/2020   Last hemoglobin A1c Lab Results  Component Value Date   HGBA1C 5.2 07/12/2020   Last thyroid functions Lab Results  Component Value Date   TSH 1.83 07/12/2020   Last vitamin D Lab Results  Component Value Date   VD25OH 52 07/12/2020      Assessment & Plan:    Routine Health Maintenance and Physical Exam  Immunization History  Administered Date(s) Administered   Influenza Inj Mdck Quad Pf 07/21/2020   Influenza,inj,Quad PF,6+ Mos 06/11/2022   Influenza,inj,quad, With Preservative 06/19/2018   PFIZER(Purple Top)SARS-COV-2 Vaccination 04/30/2020, 05/21/2020   Td 09/12/2015    Health Maintenance  Topic Date Due   Hepatitis C Screening  Never done   COLONOSCOPY (Pts 45-40yr Insurance coverage will need to be confirmed)  Never done   Zoster Vaccines- Shingrix (1 of 2) Never done   COVID-19 Vaccine (3 - 2023-24 season) 11/01/2022 (Originally 05/12/2022)   PAP SMEAR-Modifier  02/10/2023   MAMMOGRAM  01/05/2024   DTaP/Tdap/Td (2 - Tdap) 09/11/2025   INFLUENZA VACCINE  Completed   HIV Screening   Completed   HPV VACCINES  Aged Out    Discussed health benefits of physical activity, and encouraged her to engage in regular exercise appropriate for her age and condition.  Problem List Items Addressed This Visit       GAD (generalized anxiety disorder)    Symptoms are currently well-controlled.  She is prescribed trazodone 50 mg nightly.  Has previously been prescribed Xanax for as needed use but has not needed to use it. -No changes today      Weight gain    Received care at a weight loss clinic and is currently prescribed Vyvanse      Mixed hyperlipidemia - Primary    Repeat lipid panel ordered today      Encounter for well adult exam with abnormal findings    Presenting today for her annual physical exam.  Previous records and labs been reviewed. -Labs from May 2023 reviewed.  Repeat lipid panel ordered today as well as one-time HCV screening -Cologuard ordered today -Additional HM items are up-to-date -We will tentatively plan for follow-up in 1 year      Return in about 1 year (around 10/17/2023) for CPE.   PJohnette Abraham MD

## 2022-10-16 NOTE — Patient Instructions (Signed)
It was a pleasure to see you today.  Thank you for giving [place] the opportunity to be involved in your care.  Below is a brief recap of your visit and next steps.  We will plan to see you again in 1 year.  Summary We completed your annual exam today I have ordered a repeat cholesterol panel and one time hepatitis C screening Cologuard has been ordered for colon cancer screening Follow up in 1 year

## 2022-10-18 DIAGNOSIS — Z0001 Encounter for general adult medical examination with abnormal findings: Secondary | ICD-10-CM | POA: Insufficient documentation

## 2022-10-18 NOTE — Assessment & Plan Note (Signed)
Symptoms are currently well-controlled.  She is prescribed trazodone 50 mg nightly.  Has previously been prescribed Xanax for as needed use but has not needed to use it. -No changes today

## 2022-10-18 NOTE — Assessment & Plan Note (Signed)
Presenting today for her annual physical exam.  Previous records and labs been reviewed. -Labs from May 2023 reviewed.  Repeat lipid panel ordered today as well as one-time HCV screening -Cologuard ordered today -Additional HM items are up-to-date -We will tentatively plan for follow-up in 1 year

## 2022-10-18 NOTE — Assessment & Plan Note (Signed)
Received care at a weight loss clinic and is currently prescribed Vyvanse

## 2022-10-18 NOTE — Assessment & Plan Note (Signed)
Repeat lipid panel ordered today

## 2022-10-30 ENCOUNTER — Other Ambulatory Visit (HOSPITAL_COMMUNITY): Payer: Self-pay

## 2022-10-30 DIAGNOSIS — K5904 Chronic idiopathic constipation: Secondary | ICD-10-CM | POA: Diagnosis not present

## 2022-10-30 DIAGNOSIS — F909 Attention-deficit hyperactivity disorder, unspecified type: Secondary | ICD-10-CM | POA: Diagnosis not present

## 2022-10-30 DIAGNOSIS — Z6826 Body mass index (BMI) 26.0-26.9, adult: Secondary | ICD-10-CM | POA: Diagnosis not present

## 2022-10-30 DIAGNOSIS — E663 Overweight: Secondary | ICD-10-CM | POA: Diagnosis not present

## 2022-10-30 MED ORDER — LISDEXAMFETAMINE DIMESYLATE 20 MG PO CHEW
20.0000 mg | CHEWABLE_TABLET | Freq: Every day | ORAL | 0 refills | Status: DC
  Filled 2022-10-30: qty 30, 30d supply, fill #0

## 2022-10-30 MED ORDER — TRAZODONE HCL 50 MG PO TABS
50.0000 mg | ORAL_TABLET | Freq: Every day | ORAL | 3 refills | Status: DC
  Filled 2022-10-30: qty 60, 30d supply, fill #0

## 2022-10-30 MED ORDER — LISDEXAMFETAMINE DIMESYLATE 20 MG PO CAPS
20.0000 mg | ORAL_CAPSULE | Freq: Every day | ORAL | 0 refills | Status: DC
  Filled 2022-10-30: qty 30, 30d supply, fill #0

## 2022-10-30 MED ORDER — LISDEXAMFETAMINE DIMESYLATE 40 MG PO CAPS
40.0000 mg | ORAL_CAPSULE | Freq: Every morning | ORAL | 0 refills | Status: DC
  Filled 2022-10-30: qty 30, 30d supply, fill #0

## 2022-10-30 MED ORDER — LISDEXAMFETAMINE DIMESYLATE 40 MG PO CHEW
40.0000 mg | CHEWABLE_TABLET | Freq: Every morning | ORAL | 0 refills | Status: DC
  Filled 2022-10-30: qty 30, 30d supply, fill #0

## 2022-11-02 ENCOUNTER — Other Ambulatory Visit (HOSPITAL_COMMUNITY): Payer: Self-pay

## 2022-11-27 ENCOUNTER — Other Ambulatory Visit (HOSPITAL_COMMUNITY): Payer: Self-pay

## 2022-11-27 DIAGNOSIS — F909 Attention-deficit hyperactivity disorder, unspecified type: Secondary | ICD-10-CM | POA: Diagnosis not present

## 2022-11-27 DIAGNOSIS — F5104 Psychophysiologic insomnia: Secondary | ICD-10-CM | POA: Diagnosis not present

## 2022-11-27 DIAGNOSIS — E663 Overweight: Secondary | ICD-10-CM | POA: Diagnosis not present

## 2022-11-27 DIAGNOSIS — K5904 Chronic idiopathic constipation: Secondary | ICD-10-CM | POA: Diagnosis not present

## 2022-11-27 DIAGNOSIS — R632 Polyphagia: Secondary | ICD-10-CM | POA: Diagnosis not present

## 2022-11-27 DIAGNOSIS — Z6826 Body mass index (BMI) 26.0-26.9, adult: Secondary | ICD-10-CM | POA: Diagnosis not present

## 2022-11-27 MED ORDER — LINZESS 290 MCG PO CAPS
ORAL_CAPSULE | ORAL | 1 refills | Status: DC
  Filled 2022-11-27: qty 90, 90d supply, fill #0
  Filled 2023-02-26: qty 90, 90d supply, fill #1

## 2022-11-27 MED ORDER — PHENTERMINE HCL 37.5 MG PO TABS
ORAL_TABLET | ORAL | 0 refills | Status: DC
  Filled 2022-11-27: qty 45, 30d supply, fill #0

## 2022-11-27 MED ORDER — TRAZODONE HCL 50 MG PO TABS
50.0000 mg | ORAL_TABLET | Freq: Every day | ORAL | 3 refills | Status: DC
  Filled 2022-11-27: qty 60, 30d supply, fill #0
  Filled 2023-04-20 – 2023-04-23 (×2): qty 60, 30d supply, fill #1
  Filled 2023-06-06 (×2): qty 60, 30d supply, fill #2

## 2022-11-28 ENCOUNTER — Other Ambulatory Visit (HOSPITAL_COMMUNITY): Payer: Self-pay

## 2022-11-29 ENCOUNTER — Other Ambulatory Visit (HOSPITAL_COMMUNITY): Payer: Self-pay

## 2023-01-01 ENCOUNTER — Other Ambulatory Visit (HOSPITAL_COMMUNITY): Payer: Self-pay

## 2023-01-01 DIAGNOSIS — F418 Other specified anxiety disorders: Secondary | ICD-10-CM | POA: Diagnosis not present

## 2023-01-01 DIAGNOSIS — R632 Polyphagia: Secondary | ICD-10-CM | POA: Diagnosis not present

## 2023-01-01 DIAGNOSIS — Z9189 Other specified personal risk factors, not elsewhere classified: Secondary | ICD-10-CM | POA: Diagnosis not present

## 2023-01-01 DIAGNOSIS — Z6826 Body mass index (BMI) 26.0-26.9, adult: Secondary | ICD-10-CM | POA: Diagnosis not present

## 2023-01-01 DIAGNOSIS — Z8639 Personal history of other endocrine, nutritional and metabolic disease: Secondary | ICD-10-CM | POA: Diagnosis not present

## 2023-01-01 DIAGNOSIS — E663 Overweight: Secondary | ICD-10-CM | POA: Diagnosis not present

## 2023-01-01 DIAGNOSIS — F5104 Psychophysiologic insomnia: Secondary | ICD-10-CM | POA: Diagnosis not present

## 2023-01-01 MED ORDER — TRAZODONE HCL 50 MG PO TABS
50.0000 mg | ORAL_TABLET | Freq: Every evening | ORAL | 3 refills | Status: DC
  Filled 2023-01-01: qty 90, 45d supply, fill #0
  Filled 2023-02-26: qty 90, 45d supply, fill #1
  Filled 2023-07-02: qty 90, 45d supply, fill #2

## 2023-01-01 MED ORDER — PHENTERMINE HCL 37.5 MG PO TABS
ORAL_TABLET | ORAL | 0 refills | Status: DC
  Filled 2023-01-01: qty 45, 30d supply, fill #0

## 2023-01-29 ENCOUNTER — Other Ambulatory Visit (HOSPITAL_COMMUNITY): Payer: Self-pay

## 2023-01-29 DIAGNOSIS — K5904 Chronic idiopathic constipation: Secondary | ICD-10-CM | POA: Diagnosis not present

## 2023-01-29 DIAGNOSIS — Z8639 Personal history of other endocrine, nutritional and metabolic disease: Secondary | ICD-10-CM | POA: Diagnosis not present

## 2023-01-29 DIAGNOSIS — R632 Polyphagia: Secondary | ICD-10-CM | POA: Diagnosis not present

## 2023-01-29 DIAGNOSIS — E663 Overweight: Secondary | ICD-10-CM | POA: Diagnosis not present

## 2023-01-29 DIAGNOSIS — F418 Other specified anxiety disorders: Secondary | ICD-10-CM | POA: Diagnosis not present

## 2023-01-29 DIAGNOSIS — Z6827 Body mass index (BMI) 27.0-27.9, adult: Secondary | ICD-10-CM | POA: Diagnosis not present

## 2023-01-29 MED ORDER — PHENTERMINE HCL 37.5 MG PO TABS
37.5000 mg | ORAL_TABLET | Freq: Every morning | ORAL | 0 refills | Status: DC
  Filled 2023-01-29: qty 45, 30d supply, fill #0

## 2023-01-31 ENCOUNTER — Other Ambulatory Visit (HOSPITAL_COMMUNITY): Payer: Self-pay

## 2023-01-31 MED ORDER — ESCITALOPRAM OXALATE 10 MG PO TABS
10.0000 mg | ORAL_TABLET | Freq: Every day | ORAL | 1 refills | Status: DC
  Filled 2023-01-31: qty 90, 90d supply, fill #0

## 2023-02-08 ENCOUNTER — Other Ambulatory Visit (HOSPITAL_COMMUNITY): Payer: Self-pay

## 2023-02-15 ENCOUNTER — Other Ambulatory Visit (HOSPITAL_COMMUNITY): Payer: Self-pay | Admitting: Obstetrics and Gynecology

## 2023-02-15 DIAGNOSIS — Z1231 Encounter for screening mammogram for malignant neoplasm of breast: Secondary | ICD-10-CM

## 2023-02-22 ENCOUNTER — Inpatient Hospital Stay (HOSPITAL_COMMUNITY): Admission: RE | Admit: 2023-02-22 | Payer: Commercial Managed Care - PPO | Source: Ambulatory Visit

## 2023-02-26 ENCOUNTER — Other Ambulatory Visit (HOSPITAL_COMMUNITY): Payer: Self-pay | Admitting: Family Medicine

## 2023-02-26 ENCOUNTER — Ambulatory Visit (HOSPITAL_COMMUNITY)
Admission: RE | Admit: 2023-02-26 | Discharge: 2023-02-26 | Disposition: A | Discharge status: Home / Self Care | Payer: Commercial Managed Care - PPO | Source: Ambulatory Visit | Attending: Family Medicine | Admitting: Family Medicine

## 2023-02-26 ENCOUNTER — Other Ambulatory Visit (HOSPITAL_COMMUNITY): Payer: Self-pay

## 2023-02-26 DIAGNOSIS — R632 Polyphagia: Secondary | ICD-10-CM | POA: Diagnosis not present

## 2023-02-26 DIAGNOSIS — M79652 Pain in left thigh: Secondary | ICD-10-CM | POA: Diagnosis not present

## 2023-02-26 DIAGNOSIS — M79605 Pain in left leg: Secondary | ICD-10-CM | POA: Diagnosis not present

## 2023-02-26 DIAGNOSIS — I83892 Varicose veins of left lower extremities with other complications: Secondary | ICD-10-CM

## 2023-02-26 DIAGNOSIS — E663 Overweight: Secondary | ICD-10-CM | POA: Diagnosis not present

## 2023-02-26 DIAGNOSIS — Z6827 Body mass index (BMI) 27.0-27.9, adult: Secondary | ICD-10-CM | POA: Diagnosis not present

## 2023-02-26 MED ORDER — PHENTERMINE HCL 37.5 MG PO TABS
ORAL_TABLET | ORAL | 0 refills | Status: DC
  Filled 2023-02-26: qty 45, 30d supply, fill #0

## 2023-03-07 DIAGNOSIS — H1089 Other conjunctivitis: Secondary | ICD-10-CM | POA: Diagnosis not present

## 2023-03-07 DIAGNOSIS — R030 Elevated blood-pressure reading, without diagnosis of hypertension: Secondary | ICD-10-CM | POA: Diagnosis not present

## 2023-03-07 DIAGNOSIS — Z6826 Body mass index (BMI) 26.0-26.9, adult: Secondary | ICD-10-CM | POA: Diagnosis not present

## 2023-03-07 DIAGNOSIS — E663 Overweight: Secondary | ICD-10-CM | POA: Diagnosis not present

## 2023-03-09 DIAGNOSIS — M79662 Pain in left lower leg: Secondary | ICD-10-CM | POA: Diagnosis not present

## 2023-03-09 DIAGNOSIS — I8312 Varicose veins of left lower extremity with inflammation: Secondary | ICD-10-CM | POA: Diagnosis not present

## 2023-03-09 DIAGNOSIS — M79661 Pain in right lower leg: Secondary | ICD-10-CM | POA: Diagnosis not present

## 2023-03-09 DIAGNOSIS — I83893 Varicose veins of bilateral lower extremities with other complications: Secondary | ICD-10-CM | POA: Diagnosis not present

## 2023-03-09 DIAGNOSIS — I8311 Varicose veins of right lower extremity with inflammation: Secondary | ICD-10-CM | POA: Diagnosis not present

## 2023-03-14 ENCOUNTER — Ambulatory Visit (HOSPITAL_COMMUNITY): Payer: Commercial Managed Care - PPO

## 2023-03-26 ENCOUNTER — Other Ambulatory Visit (HOSPITAL_COMMUNITY): Payer: Self-pay

## 2023-03-26 MED ORDER — AMPHETAMINE-DEXTROAMPHET ER 20 MG PO CP24
20.0000 mg | ORAL_CAPSULE | Freq: Every day | ORAL | 0 refills | Status: DC
  Filled 2023-03-26: qty 30, 30d supply, fill #0

## 2023-03-30 ENCOUNTER — Ambulatory Visit (HOSPITAL_COMMUNITY): Payer: Commercial Managed Care - PPO

## 2023-04-02 ENCOUNTER — Encounter (HOSPITAL_COMMUNITY): Payer: Self-pay

## 2023-04-02 ENCOUNTER — Ambulatory Visit (HOSPITAL_COMMUNITY)
Admission: RE | Admit: 2023-04-02 | Discharge: 2023-04-02 | Disposition: A | Discharge status: Home / Self Care | Payer: Commercial Managed Care - PPO | Source: Ambulatory Visit | Attending: Obstetrics and Gynecology | Admitting: Obstetrics and Gynecology

## 2023-04-02 DIAGNOSIS — Z1231 Encounter for screening mammogram for malignant neoplasm of breast: Secondary | ICD-10-CM | POA: Insufficient documentation

## 2023-04-19 ENCOUNTER — Other Ambulatory Visit (HOSPITAL_COMMUNITY): Payer: Self-pay

## 2023-04-19 DIAGNOSIS — R632 Polyphagia: Secondary | ICD-10-CM | POA: Diagnosis not present

## 2023-04-19 DIAGNOSIS — E663 Overweight: Secondary | ICD-10-CM | POA: Diagnosis not present

## 2023-04-19 DIAGNOSIS — F418 Other specified anxiety disorders: Secondary | ICD-10-CM | POA: Diagnosis not present

## 2023-04-19 DIAGNOSIS — Z6826 Body mass index (BMI) 26.0-26.9, adult: Secondary | ICD-10-CM | POA: Diagnosis not present

## 2023-04-19 DIAGNOSIS — F909 Attention-deficit hyperactivity disorder, unspecified type: Secondary | ICD-10-CM | POA: Diagnosis not present

## 2023-04-19 MED ORDER — AMPHETAMINE-DEXTROAMPHET ER 20 MG PO CP24
20.0000 mg | ORAL_CAPSULE | Freq: Two times a day (BID) | ORAL | 0 refills | Status: DC
  Filled 2023-04-19 – 2023-04-20 (×2): qty 60, 30d supply, fill #0

## 2023-04-20 ENCOUNTER — Other Ambulatory Visit: Payer: Self-pay

## 2023-04-20 ENCOUNTER — Other Ambulatory Visit (INDEPENDENT_AMBULATORY_CARE_PROVIDER_SITE_OTHER): Payer: Commercial Managed Care - PPO | Admitting: General Surgery

## 2023-04-20 ENCOUNTER — Other Ambulatory Visit (HOSPITAL_COMMUNITY): Payer: Self-pay

## 2023-04-20 DIAGNOSIS — Z09 Encounter for follow-up examination after completed treatment for conditions other than malignant neoplasm: Secondary | ICD-10-CM

## 2023-04-20 MED ORDER — DOXYCYCLINE HYCLATE 100 MG PO TABS
100.0000 mg | ORAL_TABLET | Freq: Every day | ORAL | 1 refills | Status: DC
  Filled 2023-04-20 – 2023-04-23 (×2): qty 30, 30d supply, fill #0

## 2023-04-20 NOTE — Progress Notes (Signed)
Vibramycin for skin infection/ezema

## 2023-04-23 ENCOUNTER — Other Ambulatory Visit: Payer: Self-pay

## 2023-04-23 ENCOUNTER — Other Ambulatory Visit (HOSPITAL_COMMUNITY): Payer: Self-pay

## 2023-04-30 ENCOUNTER — Other Ambulatory Visit (HOSPITAL_COMMUNITY): Payer: Self-pay

## 2023-05-09 ENCOUNTER — Other Ambulatory Visit (HOSPITAL_COMMUNITY): Payer: Self-pay

## 2023-05-09 MED ORDER — AMPHETAMINE-DEXTROAMPHET ER 25 MG PO CP24
ORAL_CAPSULE | ORAL | 0 refills | Status: DC
  Filled 2023-05-09: qty 20, 10d supply, fill #0
  Filled 2023-05-09: qty 40, 20d supply, fill #0

## 2023-05-10 ENCOUNTER — Other Ambulatory Visit (HOSPITAL_COMMUNITY): Payer: Self-pay

## 2023-06-06 ENCOUNTER — Other Ambulatory Visit (HOSPITAL_COMMUNITY): Payer: Self-pay

## 2023-06-06 MED ORDER — AMPHETAMINE-DEXTROAMPHET ER 25 MG PO CP24
ORAL_CAPSULE | ORAL | 0 refills | Status: AC
Start: 2023-06-06 — End: ?
  Filled 2023-06-06: qty 60, 30d supply, fill #0

## 2023-06-08 ENCOUNTER — Other Ambulatory Visit (HOSPITAL_COMMUNITY): Payer: Self-pay

## 2023-06-08 MED ORDER — LINZESS 290 MCG PO CAPS
290.0000 ug | ORAL_CAPSULE | Freq: Every day | ORAL | 1 refills | Status: AC
  Filled 2023-09-04 – 2023-09-17 (×3): qty 90, 90d supply, fill #0

## 2023-06-11 ENCOUNTER — Other Ambulatory Visit (HOSPITAL_COMMUNITY): Payer: Self-pay

## 2023-06-11 DIAGNOSIS — Z1331 Encounter for screening for depression: Secondary | ICD-10-CM | POA: Diagnosis not present

## 2023-06-11 DIAGNOSIS — Z01419 Encounter for gynecological examination (general) (routine) without abnormal findings: Secondary | ICD-10-CM | POA: Diagnosis not present

## 2023-06-11 MED ORDER — AMPHETAMINE-DEXTROAMPHET ER 25 MG PO CP24
25.0000 mg | ORAL_CAPSULE | Freq: Two times a day (BID) | ORAL | 0 refills | Status: DC
  Filled 2023-06-11 – 2023-07-04 (×2): qty 60, 30d supply, fill #0

## 2023-06-11 MED ORDER — AMPHETAMINE-DEXTROAMPHET ER 25 MG PO CP24: 25.0000 mg | ORAL_CAPSULE | Freq: Two times a day (BID) | ORAL | 0 refills | Status: DC

## 2023-06-15 DIAGNOSIS — R600 Localized edema: Secondary | ICD-10-CM | POA: Diagnosis not present

## 2023-06-15 DIAGNOSIS — I8311 Varicose veins of right lower extremity with inflammation: Secondary | ICD-10-CM | POA: Diagnosis not present

## 2023-06-15 DIAGNOSIS — I8312 Varicose veins of left lower extremity with inflammation: Secondary | ICD-10-CM | POA: Diagnosis not present

## 2023-06-15 DIAGNOSIS — I83891 Varicose veins of right lower extremities with other complications: Secondary | ICD-10-CM | POA: Diagnosis not present

## 2023-06-15 DIAGNOSIS — R252 Cramp and spasm: Secondary | ICD-10-CM | POA: Diagnosis not present

## 2023-07-02 ENCOUNTER — Other Ambulatory Visit (HOSPITAL_COMMUNITY): Payer: Self-pay

## 2023-07-02 DIAGNOSIS — F909 Attention-deficit hyperactivity disorder, unspecified type: Secondary | ICD-10-CM | POA: Diagnosis not present

## 2023-07-02 DIAGNOSIS — R632 Polyphagia: Secondary | ICD-10-CM | POA: Diagnosis not present

## 2023-07-02 DIAGNOSIS — E663 Overweight: Secondary | ICD-10-CM | POA: Diagnosis not present

## 2023-07-02 DIAGNOSIS — M79604 Pain in right leg: Secondary | ICD-10-CM | POA: Diagnosis not present

## 2023-07-02 DIAGNOSIS — M79605 Pain in left leg: Secondary | ICD-10-CM | POA: Diagnosis not present

## 2023-07-02 DIAGNOSIS — Z6827 Body mass index (BMI) 27.0-27.9, adult: Secondary | ICD-10-CM | POA: Diagnosis not present

## 2023-07-04 ENCOUNTER — Other Ambulatory Visit (HOSPITAL_COMMUNITY): Payer: Self-pay

## 2023-07-11 ENCOUNTER — Ambulatory Visit (INDEPENDENT_AMBULATORY_CARE_PROVIDER_SITE_OTHER): Payer: Commercial Managed Care - PPO

## 2023-07-11 DIAGNOSIS — Z23 Encounter for immunization: Secondary | ICD-10-CM | POA: Diagnosis not present

## 2023-07-16 ENCOUNTER — Other Ambulatory Visit (HOSPITAL_COMMUNITY): Payer: Self-pay

## 2023-07-30 DIAGNOSIS — F5101 Primary insomnia: Secondary | ICD-10-CM | POA: Diagnosis not present

## 2023-07-30 DIAGNOSIS — Z6827 Body mass index (BMI) 27.0-27.9, adult: Secondary | ICD-10-CM | POA: Diagnosis not present

## 2023-07-30 DIAGNOSIS — E663 Overweight: Secondary | ICD-10-CM | POA: Diagnosis not present

## 2023-07-30 DIAGNOSIS — R632 Polyphagia: Secondary | ICD-10-CM | POA: Diagnosis not present

## 2023-07-30 DIAGNOSIS — F418 Other specified anxiety disorders: Secondary | ICD-10-CM | POA: Diagnosis not present

## 2023-07-30 DIAGNOSIS — Z1331 Encounter for screening for depression: Secondary | ICD-10-CM | POA: Diagnosis not present

## 2023-08-24 DIAGNOSIS — I83892 Varicose veins of left lower extremities with other complications: Secondary | ICD-10-CM | POA: Diagnosis not present

## 2023-08-29 DIAGNOSIS — I83891 Varicose veins of right lower extremities with other complications: Secondary | ICD-10-CM | POA: Diagnosis not present

## 2023-09-04 ENCOUNTER — Other Ambulatory Visit (HOSPITAL_COMMUNITY): Payer: Self-pay

## 2023-09-04 DIAGNOSIS — Z09 Encounter for follow-up examination after completed treatment for conditions other than malignant neoplasm: Secondary | ICD-10-CM | POA: Diagnosis not present

## 2023-09-04 DIAGNOSIS — I83893 Varicose veins of bilateral lower extremities with other complications: Secondary | ICD-10-CM | POA: Diagnosis not present

## 2023-09-10 ENCOUNTER — Other Ambulatory Visit (HOSPITAL_COMMUNITY): Payer: Self-pay

## 2023-09-10 ENCOUNTER — Other Ambulatory Visit: Payer: Self-pay

## 2023-09-11 DIAGNOSIS — I87391 Chronic venous hypertension (idiopathic) with other complications of right lower extremity: Secondary | ICD-10-CM | POA: Diagnosis not present

## 2023-09-13 ENCOUNTER — Other Ambulatory Visit: Payer: Self-pay

## 2023-09-17 ENCOUNTER — Other Ambulatory Visit (HOSPITAL_COMMUNITY): Payer: Self-pay

## 2023-09-17 ENCOUNTER — Other Ambulatory Visit: Payer: Self-pay

## 2023-09-21 DIAGNOSIS — I8393 Asymptomatic varicose veins of bilateral lower extremities: Secondary | ICD-10-CM | POA: Insufficient documentation

## 2023-10-12 ENCOUNTER — Other Ambulatory Visit (HOSPITAL_COMMUNITY): Payer: Self-pay

## 2023-10-19 ENCOUNTER — Encounter: Payer: Commercial Managed Care - PPO | Admitting: Internal Medicine

## 2023-11-24 IMAGING — MG MM DIGITAL SCREENING BILAT W/ TOMO AND CAD
8 series · 9 of 24 positions shown · non-contrast
Comparison: Previous exam(s).

CLINICAL DATA: Screening.

EXAM:
DIGITAL SCREENING BILATERAL MAMMOGRAM WITH TOMOSYNTHESIS AND CAD
TECHNIQUE: Bilateral screening digital craniocaudal and mediolateral oblique
mammograms were obtained. Bilateral screening digital breast
tomosynthesis was performed. The images were evaluated with
computer-aided detection.

[R CC synth-2D]
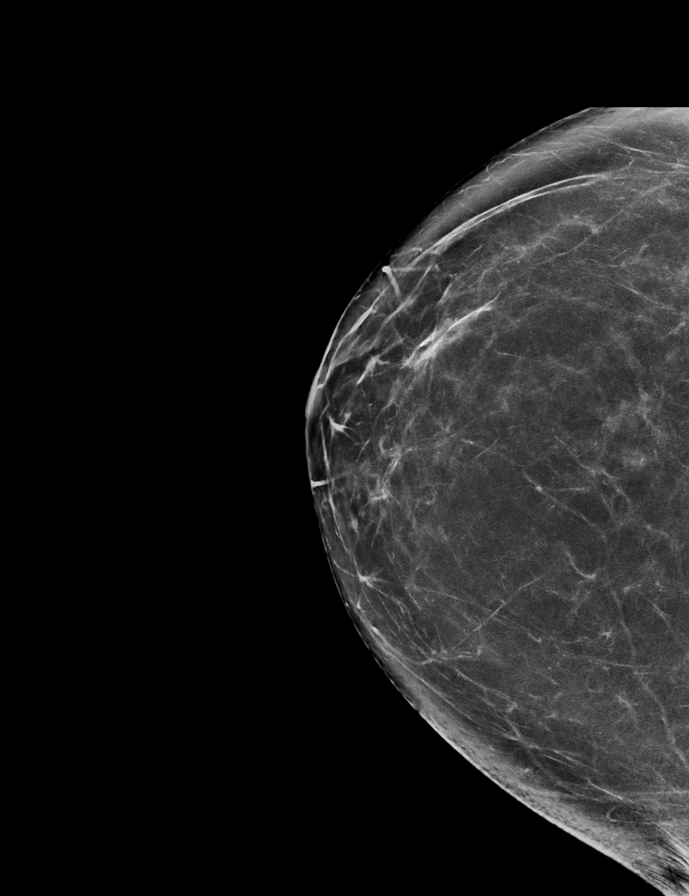

[L MLO synth-2D]
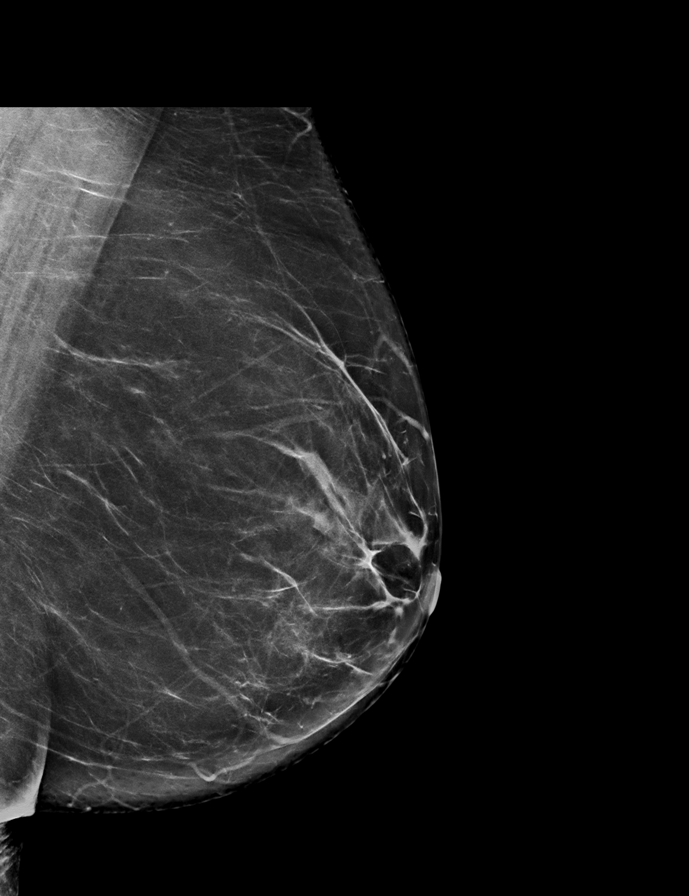

[L CC synth-2D]
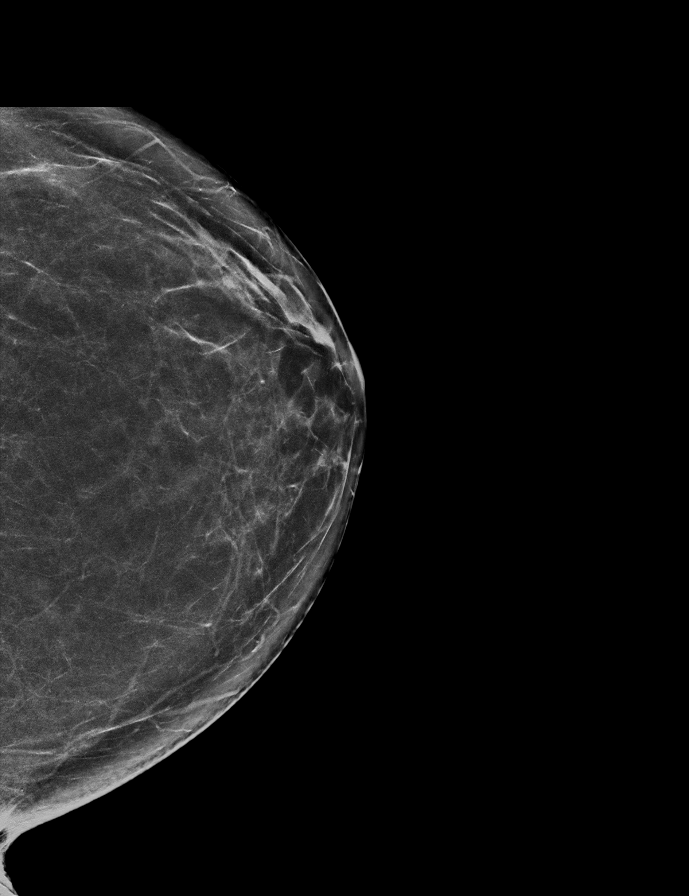

[R MLO synth-2D]
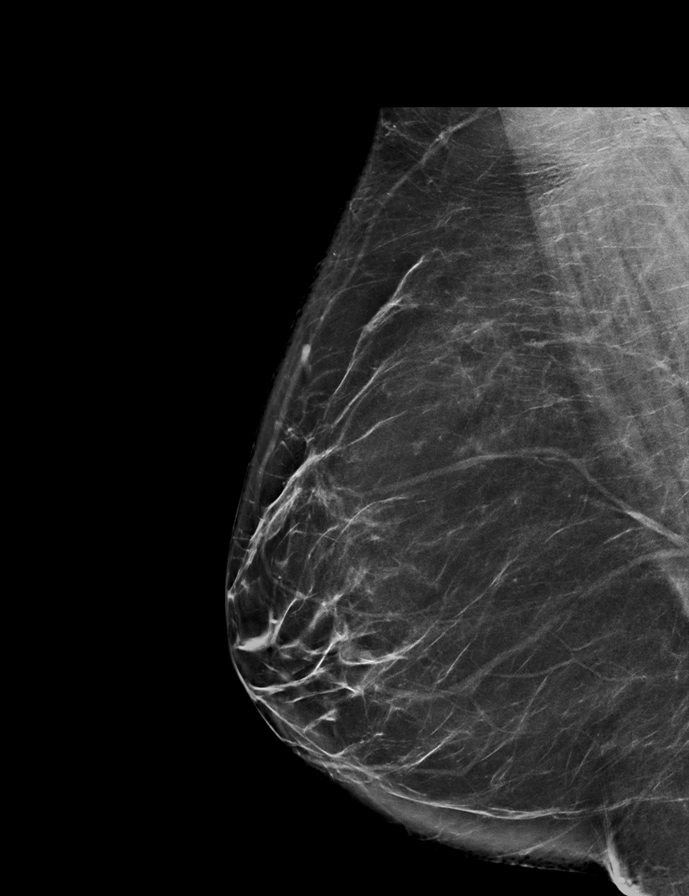

[L MLO tomo · 2 of 79 frames shown]
[frame 26/79]
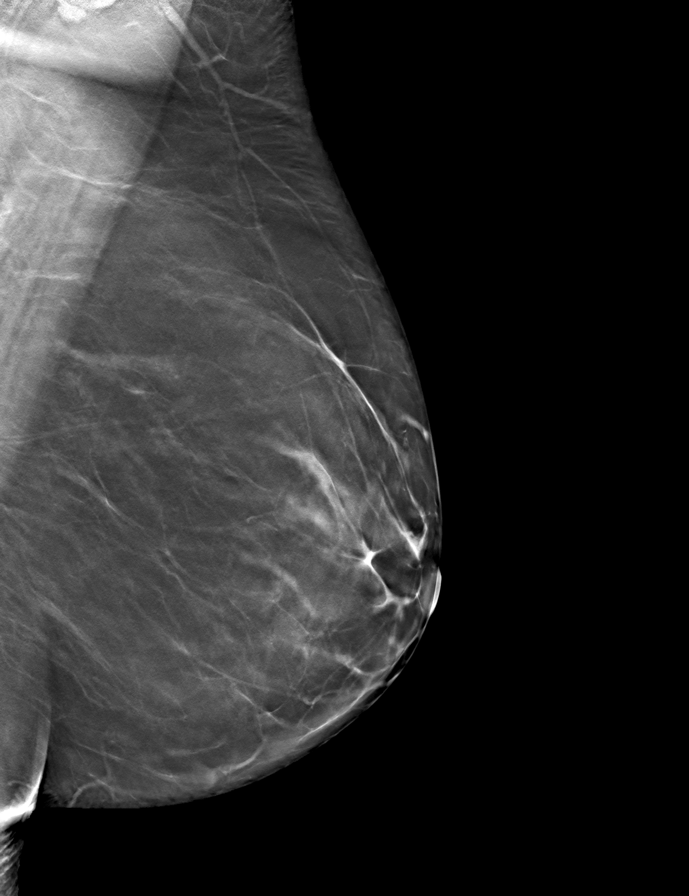
[frame 40/79]
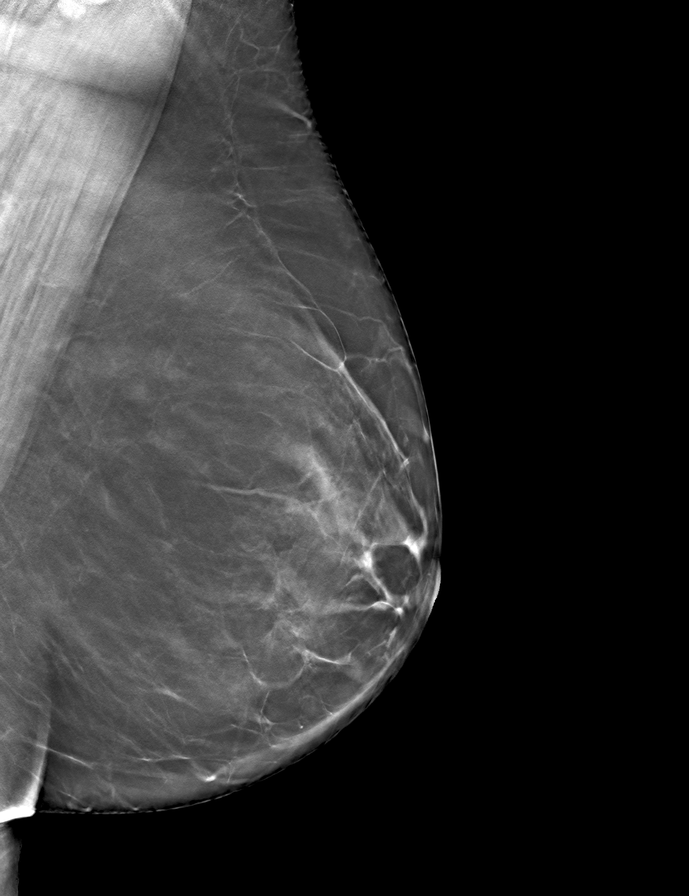

[R CC tomo · tomo slice 37/73.0]
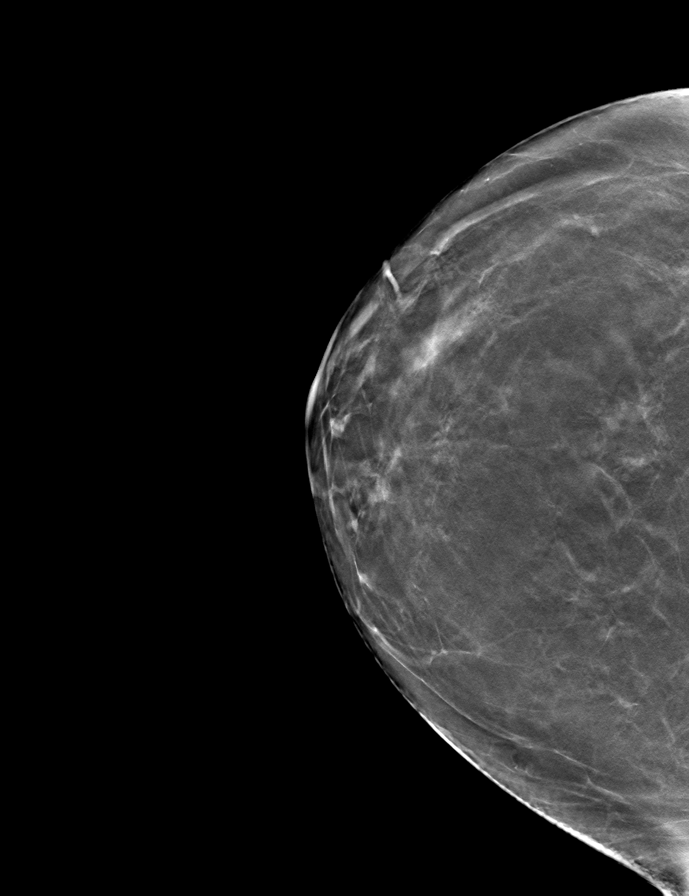

[R MLO tomo · tomo slice 39/78.0]
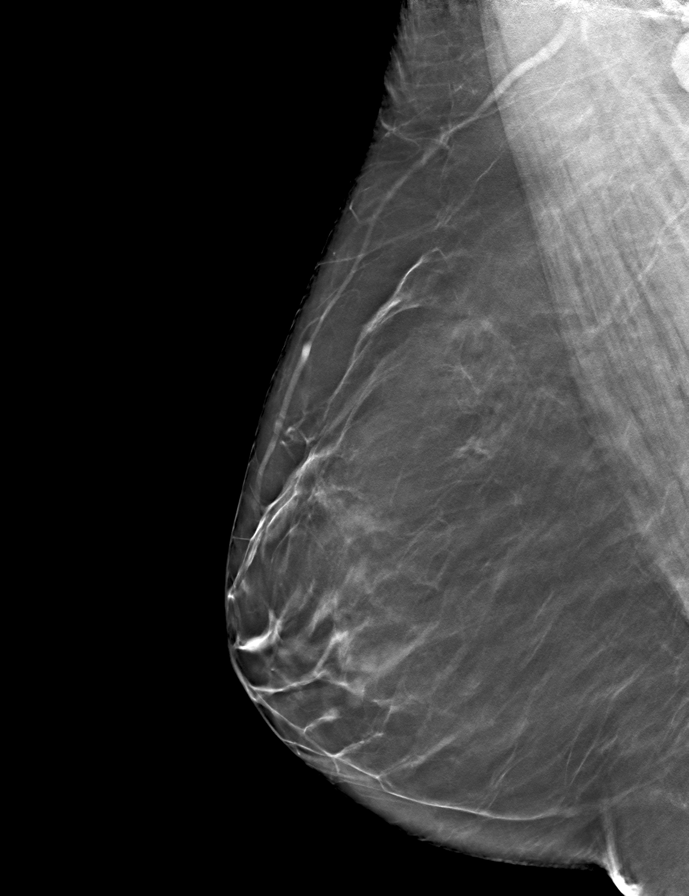

[L CC tomo · tomo slice 37/73.0]
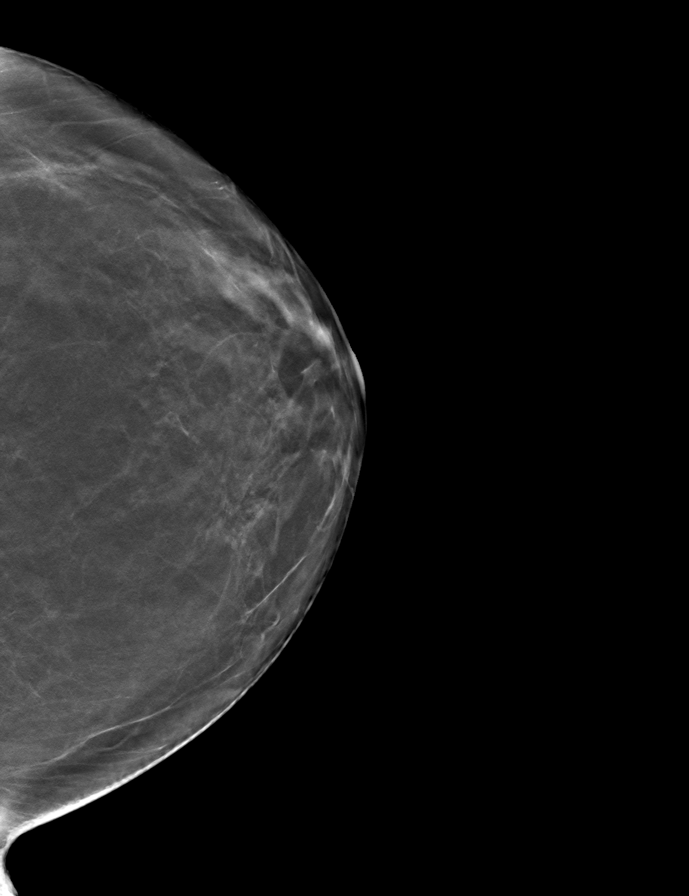

[9 of 24 positions shown; findings below may reference images not displayed]

ACR Breast Density Category b: There are scattered areas of
fibroglandular density.
FINDINGS: There are no findings suspicious for malignancy.
IMPRESSION: No mammographic evidence of malignancy. A result letter of this
screening mammogram will be mailed directly to the patient.

RECOMMENDATION:
Screening mammogram in one year. (Code:51-O-LD2)

BI-RADS CATEGORY  1: Negative.

## 2023-12-19 ENCOUNTER — Other Ambulatory Visit (HOSPITAL_COMMUNITY): Payer: Self-pay

## 2024-03-26 ENCOUNTER — Other Ambulatory Visit (HOSPITAL_COMMUNITY): Payer: Self-pay | Admitting: Obstetrics and Gynecology

## 2024-03-26 DIAGNOSIS — Z1231 Encounter for screening mammogram for malignant neoplasm of breast: Secondary | ICD-10-CM

## 2024-05-19 ENCOUNTER — Ambulatory Visit (HOSPITAL_COMMUNITY): Payer: Self-pay

## 2024-06-02 ENCOUNTER — Ambulatory Visit (HOSPITAL_COMMUNITY)
Admission: RE | Admit: 2024-06-02 | Discharge: 2024-06-02 | Disposition: A | Discharge status: Home / Self Care | Payer: Self-pay | Source: Ambulatory Visit | Attending: Obstetrics and Gynecology | Admitting: Obstetrics and Gynecology

## 2024-06-02 ENCOUNTER — Encounter (HOSPITAL_COMMUNITY): Payer: Self-pay

## 2024-06-02 DIAGNOSIS — Z1231 Encounter for screening mammogram for malignant neoplasm of breast: Secondary | ICD-10-CM | POA: Diagnosis present

## 2024-06-17 ENCOUNTER — Other Ambulatory Visit (HOSPITAL_COMMUNITY): Payer: Self-pay | Admitting: Obstetrics and Gynecology

## 2024-06-17 DIAGNOSIS — Z13820 Encounter for screening for osteoporosis: Secondary | ICD-10-CM

## 2024-07-04 ENCOUNTER — Other Ambulatory Visit (HOSPITAL_COMMUNITY)

## 2024-07-11 ENCOUNTER — Other Ambulatory Visit (HOSPITAL_COMMUNITY)

## 2024-08-01 ENCOUNTER — Encounter: Payer: Self-pay | Admitting: Family Medicine

## 2024-08-01 ENCOUNTER — Ambulatory Visit: Payer: Self-pay | Admitting: Family Medicine

## 2024-08-01 VITALS — BP 118/84 | HR 66 | Ht 67.0 in | Wt 183.2 lb

## 2024-08-01 DIAGNOSIS — Z636 Dependent relative needing care at home: Secondary | ICD-10-CM

## 2024-08-01 DIAGNOSIS — I83892 Varicose veins of left lower extremities with other complications: Secondary | ICD-10-CM

## 2024-08-01 DIAGNOSIS — R948 Abnormal results of function studies of other organs and systems: Secondary | ICD-10-CM

## 2024-08-01 DIAGNOSIS — Z6828 Body mass index (BMI) 28.0-28.9, adult: Secondary | ICD-10-CM | POA: Diagnosis not present

## 2024-08-01 DIAGNOSIS — N951 Menopausal and female climacteric states: Secondary | ICD-10-CM | POA: Diagnosis not present

## 2024-08-01 DIAGNOSIS — Z8639 Personal history of other endocrine, nutritional and metabolic disease: Secondary | ICD-10-CM | POA: Diagnosis not present

## 2024-08-01 DIAGNOSIS — E663 Overweight: Secondary | ICD-10-CM | POA: Diagnosis not present

## 2024-08-01 DIAGNOSIS — E782 Mixed hyperlipidemia: Secondary | ICD-10-CM

## 2024-08-01 DIAGNOSIS — Z85828 Personal history of other malignant neoplasm of skin: Secondary | ICD-10-CM

## 2024-08-01 DIAGNOSIS — E785 Hyperlipidemia, unspecified: Secondary | ICD-10-CM | POA: Diagnosis not present

## 2024-08-01 DIAGNOSIS — F5104 Psychophysiologic insomnia: Secondary | ICD-10-CM | POA: Diagnosis not present

## 2024-08-01 DIAGNOSIS — K5904 Chronic idiopathic constipation: Secondary | ICD-10-CM | POA: Diagnosis not present

## 2024-08-01 NOTE — Progress Notes (Signed)
 Assessment & Plan:   There are no diagnoses linked to this encounter.  No orders of the defined types were placed in this encounter.  There are no discontinued medications.  No orders of the defined types were placed in this encounter.     Assessment and Plan Assessment & Plan       No follow-ups on file.       Geni Shutter, DO  [facility] [phone] (phone) [phone] (fax) Subjective:  Discussed the use of AI scribe software for clinical note transcription with the patient, who gave verbal consent to proceed.  History of Present Illness   All past medical history, surgical history, allergies, family history, immunizations andmedications were updated in the EMR today and reviewed under the history and medication portions of their EMR. Review of Systems: Negative, with the exception of above mentioned in HPI.   Current Outpatient Medications:    ALPRAZolam (XANAX) 1 MG tablet, Take 1 mg by mouth as needed., Disp: , Rfl: 0   amphetamine -dextroamphetamine  (ADDERALL XR) 20 MG 24 hr capsule, Take 1 capsule (20 mg) by mouth 2 times daily (morning and afternoon), Disp: 60 capsule, Rfl: 0   amphetamine -dextroamphetamine  (ADDERALL XR) 25 MG 24 hr capsule, Take 1 capsule by mouth 2 (two) times daily., Disp: 60 capsule, Rfl: 0   amphetamine -dextroamphetamine  (ADDERALL XR) 25 MG 24 hr capsule, Take 1 capsule by mouth 2 (two) times daily., Disp: 60 capsule, Rfl: 0   doxycycline  (VIBRA -TABS) 100 MG tablet, Take 1 tablet (100 mg total) by mouth daily., Disp: 30 tablet, Rfl: 1   escitalopram  (LEXAPRO ) 10 MG tablet, Take 1 tablet (10 mg total) by mouth daily., Disp: 90 tablet, Rfl: 1   linaclotide  (LINZESS ) 290 MCG CAPS capsule, Take 1 capsule by mouth once daily at least 30 minutes before the first meal of the day on an empty stomach, Disp: 90 capsule, Rfl: 1   linaclotide  (LINZESS ) 290 MCG CAPS capsule, Take 1 capsule by mouth once daily  at least 30 minutes before the first meal of the day on an empty stomach, Disp: 90 capsule, Rfl: 1   Lisdexamfetamine Dimesylate  20 MG CHEW, Chew & swallow 1 tablet (20 mg total) by mouth daily at lunch., Disp: 30 tablet, Rfl: 0   Lisdexamfetamine Dimesylate  40 MG CHEW, Chew & swallow 1 tablet (40 mg total) by mouth every morning., Disp: 30 tablet, Rfl: 0   phentermine  (ADIPEX-P ) 37.5 MG tablet, Take 1 tablet by mouth in the morning and 1/2 tablet at noon, Disp: 45 tablet, Rfl: 0   traZODone  (DESYREL ) 50 MG tablet, Take 50 mg by mouth at bedtime. 1 or 2, Disp: , Rfl:    traZODone  (DESYREL ) 50 MG tablet, Take 1 - 2 tablets (50 - 100 mg total) by mouth at bedtime., Disp: 60 tablet, Rfl: 3   traZODone  (DESYREL ) 50 MG tablet, Take 1-2 tablets (50-100 mg total) by mouth at bedtime., Disp: 60 tablet, Rfl: 3   traZODone  (DESYREL ) 50 MG tablet, Take 1-2 tablets (50-100 mg total) by mouth at bedtime., Disp: 90 tablet, Rfl: 3   triamcinolone cream (KENALOG) 0.1 %, Apply topically 2 (two) times daily., Disp: , Rfl:      Objective:     VITALS: LMP 03/04/2013  Wt Readings from Last 3 Encounters:  10/16/22 165 lb 9.6 oz (75.1 kg)  07/04/21 167 lb (75.8 kg)  03/30/21 160 lb (72.6 kg)    PHYSICAL EXAM: General: The patient is well-nourished, in no  acute distress. The vital signs are documented above. Cardiac: There is a regular rate and rhythm.  Pulmonary: There is good air exchange bilaterally without wheezing or rales. Abdomen: Soft and non-tender with normal pitched bowel sounds.  Musculoskeletal: There are no major deformities or cyanosis. Neurologi: No focal weakness or paresthesias are detected. Skin: There are no ulcers or rashes noted. Psychiatric: The patient has a normal affect.  LABS: Lab Results  Component Value Date   CREATININE 0.69 07/12/2020   BUN 18 07/12/2020   NA 139 07/12/2020   K 5.0 07/12/2020   CL 105 07/12/2020   CO2 25 07/12/2020   Lab Results  Component Value Date    ALT 11 07/12/2020   AST 21 07/12/2020   BILITOT 0.5 07/12/2020   Lab Results  Component Value Date   HGBA1C 5.2 07/12/2020   No results found for: INSULIN Lab Results  Component Value Date   TSH 1.83 07/12/2020   Lab Results  Component Value Date   CHOL 163 07/12/2020   HDL 49 (L) 07/12/2020   LDLCALC 100 (H) 07/12/2020   TRIG 59 07/12/2020   CHOLHDL 3.3 07/12/2020   Lab Results  Component Value Date   VD25OH 52 07/12/2020   VD25OH 44 06/04/2019   Lab Results  Component Value Date   WBC 7.1 07/12/2020   HGB 13.3 07/12/2020   HCT 39.9 07/12/2020   MCV 94.5 07/12/2020   PLT 241 07/12/2020   No results found for: IRON, TIBC, FERRITIN

## 2024-08-03 ENCOUNTER — Encounter: Payer: Self-pay | Admitting: Family Medicine

## 2024-08-03 DIAGNOSIS — R948 Abnormal results of function studies of other organs and systems: Secondary | ICD-10-CM | POA: Insufficient documentation

## 2024-08-03 DIAGNOSIS — I83892 Varicose veins of left lower extremities with other complications: Secondary | ICD-10-CM | POA: Insufficient documentation

## 2024-08-03 DIAGNOSIS — F909 Attention-deficit hyperactivity disorder, unspecified type: Secondary | ICD-10-CM | POA: Insufficient documentation

## 2024-08-03 DIAGNOSIS — E663 Overweight: Secondary | ICD-10-CM | POA: Insufficient documentation

## 2024-08-03 DIAGNOSIS — Z636 Dependent relative needing care at home: Secondary | ICD-10-CM | POA: Insufficient documentation

## 2024-08-03 DIAGNOSIS — Z85828 Personal history of other malignant neoplasm of skin: Secondary | ICD-10-CM | POA: Insufficient documentation

## 2024-08-03 DIAGNOSIS — K5904 Chronic idiopathic constipation: Secondary | ICD-10-CM | POA: Insufficient documentation

## 2024-08-03 DIAGNOSIS — F5104 Psychophysiologic insomnia: Secondary | ICD-10-CM | POA: Insufficient documentation

## 2024-08-03 DIAGNOSIS — Z8639 Personal history of other endocrine, nutritional and metabolic disease: Secondary | ICD-10-CM | POA: Insufficient documentation

## 2024-08-03 DIAGNOSIS — N951 Menopausal and female climacteric states: Secondary | ICD-10-CM | POA: Insufficient documentation

## 2024-08-11 LAB — CBC AND DIFFERENTIAL
HCT: 43 (ref 36–46)
Hemoglobin: 14.1 (ref 12.0–16.0)
Platelets: 304 K/uL (ref 150–400)
WBC: 8

## 2024-08-11 LAB — HEPATIC FUNCTION PANEL
ALT: 35 U/L (ref 7–35)
AST: 32 (ref 13–35)
Alkaline Phosphatase: 77 (ref 25–125)
Bilirubin, Total: 0.2

## 2024-08-11 LAB — BASIC METABOLIC PANEL WITH GFR
Chloride: 103 (ref 99–108)
Creatinine: 0.7 (ref 0.5–1.1)
Glucose: 77
Potassium: 4.4 meq/L (ref 3.5–5.1)
Sodium: 140 (ref 137–147)

## 2024-08-11 LAB — TSH: TSH: 1.83 (ref 0.41–5.90)

## 2024-08-11 LAB — LIPID PANEL
Cholesterol: 245 — AB (ref 0–200)
HDL: 73 — AB (ref 35–70)
LDL Cholesterol: 147
Triglycerides: 140 (ref 40–160)

## 2024-08-11 LAB — COMPREHENSIVE METABOLIC PANEL WITH GFR
Albumin: 4.6 (ref 3.5–5.0)
eGFR: 100

## 2024-08-11 LAB — HEMOGLOBIN A1C: Hemoglobin A1C: 5.5

## 2024-08-11 LAB — CBC: RBC: 4.52 (ref 3.87–5.11)

## 2024-09-01 ENCOUNTER — Ambulatory Visit: Admitting: Family Medicine

## 2024-09-01 VITALS — BP 124/72 | HR 77 | Ht 66.0 in | Wt 184.0 lb

## 2024-09-01 DIAGNOSIS — R948 Abnormal results of function studies of other organs and systems: Secondary | ICD-10-CM

## 2024-09-01 DIAGNOSIS — Z6829 Body mass index (BMI) 29.0-29.9, adult: Secondary | ICD-10-CM

## 2024-09-01 DIAGNOSIS — E663 Overweight: Secondary | ICD-10-CM | POA: Diagnosis not present

## 2024-09-01 DIAGNOSIS — I83893 Varicose veins of bilateral lower extremities with other complications: Secondary | ICD-10-CM | POA: Diagnosis not present

## 2024-09-01 DIAGNOSIS — R635 Abnormal weight gain: Secondary | ICD-10-CM

## 2024-09-01 DIAGNOSIS — F902 Attention-deficit hyperactivity disorder, combined type: Secondary | ICD-10-CM

## 2024-09-01 DIAGNOSIS — F5104 Psychophysiologic insomnia: Secondary | ICD-10-CM | POA: Diagnosis not present

## 2024-09-01 MED ORDER — TRAZODONE HCL 50 MG PO TABS: 50.0000 mg | ORAL_TABLET | Freq: Every evening | ORAL | 3 refills | Status: AC

## 2024-09-01 MED ORDER — FUROSEMIDE 20 MG PO TABS: 20.0000 mg | ORAL_TABLET | Freq: Every day | ORAL | 3 refills | Status: AC

## 2024-09-01 MED ORDER — RYBELSUS 14 MG PO TABS: 14.0000 mg | ORAL_TABLET | Freq: Every day | ORAL | 2 refills | Status: DC

## 2024-09-01 MED ORDER — POTASSIUM CHLORIDE CRYS ER 20 MEQ PO TBCR: 20.0000 meq | EXTENDED_RELEASE_TABLET | Freq: Every day | ORAL | 3 refills | Status: AC

## 2024-09-01 NOTE — Progress Notes (Signed)
 "   Patient Care Team: Prentiss Frieze, DO as PCP - General (Family Medicine) [place], Andrez SAUNDERS, PA-C (Inactive) as Physician Assistant (Dermatology)   Assessment & Plan:   1. Weight gain (Primary) Comments: Unable to obtain GLP1RA due to lack of insurance coverage. Discussed self pay options/microdosing.  2. Psychophysiological insomnia - traZODone  (DESYREL ) 50 MG tablet; Take 1-2 tablets (50-100 mg total) by mouth at bedtime.  Dispense: 180 tablet; Refill: 3  3. Varicose veins of lower extremity with edema, bilateral - furosemide  (LASIX ) 20 MG tablet; Take 1 tablet (20 mg total) by mouth daily.  Dispense: 30 tablet; Refill: 3 - potassium chloride  SA (KLOR-CON  M) 20 MEQ tablet; Take 1 tablet (20 mEq total) by mouth daily.  Dispense: 30 tablet; Refill: 3  4. Abnormal metabolism Comments: Slow.  5. Attention deficit hyperactivity disorder (ADHD), combined type  6. Overweight with body mass index (BMI) of 29 to 29.9 in adult  [place], DO, MS, FAAFP, Dipl. [facility] [address] [place], [address] Dept: [phone] Dept Fax: [phone]  Subjective:   Review of Systems: Negative, with the exception of above mentioned in HPI.  History:   Reviewed by clinician on day of visit: allergies, medications, problem list, medical history, surgical history, family history, social history, and previous encounter notes.    Medications:   Show/hide medication list[1] Allergies[2]  Objective:   BP 124/72 (BP Location: Right Arm, Cuff Size: Normal)   Pulse 77   Ht 5' 6 (1.676 m)   Wt 184 lb (83.5 kg)   LMP 03/04/2013   SpO2 98%   BMI 29.70 kg/m    Physical Exam Constitutional:      General: She is not in acute distress.    Appearance: She is well-developed.  HENT:     Head: Normocephalic and atraumatic.  Eyes:     Conjunctiva/sclera: Conjunctivae normal.  Cardiovascular:     Rate and Rhythm: Normal rate and  regular rhythm.     Heart sounds: Normal heart sounds.  Pulmonary:     Effort: Pulmonary effort is normal.     Breath sounds: Normal breath sounds.  Neurological:     General: No focal deficit present.     Mental Status: She is alert.  Psychiatric:        Behavior: Behavior normal.       Results for orders placed or performed in visit on 09/01/24  CBC and differential   Collection Time: 08/11/24 12:00 AM  Result Value Ref Range   Hemoglobin 14.1 12.0 - 16.0   HCT 43 36 - 46   Platelets 304 150 - 400 K/uL   WBC 8.0   CBC   Collection Time: 08/11/24 12:00 AM  Result Value Ref Range   RBC 4.52 3.87 - 5.11  Basic metabolic panel with GFR   Collection Time: 08/11/24 12:00 AM  Result Value Ref Range   Glucose 77    Creatinine 0.7 0.5 - 1.1   Potassium 4.4 3.5 - 5.1 mEq/L   Sodium 140 137 - 147   Chloride 103 99 - 108  Comprehensive metabolic panel with GFR   Collection Time: 08/11/24 12:00 AM  Result Value Ref Range   eGFR 100    Albumin 4.6 3.5 - 5.0  Lipid panel   Collection Time: 08/11/24 12:00 AM  Result Value Ref Range   Triglycerides 140 40 - 160   Cholesterol 245 (A) 0 - 200   HDL 73 (A)  35 - 70   LDL Cholesterol 147   Hepatic function panel   Collection Time: 08/11/24 12:00 AM  Result Value Ref Range   Alkaline Phosphatase 77 25 - 125   ALT 35 7 - 35 U/L   AST 32 13 - 35   Bilirubin, Total 0.2   Hemoglobin A1c   Collection Time: 08/11/24 12:00 AM  Result Value Ref Range   Hemoglobin A1C 5.5   TSH   Collection Time: 08/11/24 12:00 AM  Result Value Ref Range   TSH 1.83 0.41 - 5.90    Attestations:   Reviewed by clinician on day of visit: allergies, medications, problem list, medical history, surgical history, family history, social history, and previous encounter notes.     [1]  Outpatient Medications Prior to Visit  Medication Sig   cetirizine (ZYRTEC) 10 MG tablet 1 tablet Orally Once a day   linaclotide  (LINZESS ) 290 MCG CAPS capsule Take 1  capsule by mouth once daily at least 30 minutes before the first meal of the day on an empty stomach   Magnesium Glycinate 100 MG CAPS Take 100 mg by mouth.   saccharomyces boulardii (FLORASTOR) 250 MG capsule Take 250 mg by mouth daily.   triamcinolone cream (KENALOG) 0.1 % Apply topically 2 (two) times daily.   [DISCONTINUED] traZODone  (DESYREL ) 50 MG tablet Take 1-2 tablets (50-100 mg total) by mouth at bedtime.   No facility-administered medications prior to visit.  [2]  Allergies Allergen Reactions   Citrullus Vulgaris Anaphylaxis   Peanut-Containing Drug Products Rash   Amphetamine -Dextroamphetamine      Other Reaction(s): Anger   Fish Allergy    "

## 2024-09-02 ENCOUNTER — Other Ambulatory Visit (HOSPITAL_COMMUNITY): Payer: Self-pay

## 2024-09-02 ENCOUNTER — Telehealth: Payer: Self-pay

## 2024-09-02 MED ORDER — LISDEXAMFETAMINE DIMESYLATE 20 MG PO CAPS: 20.0000 mg | ORAL_CAPSULE | Freq: Every morning | ORAL | 0 refills | Status: AC

## 2024-09-02 NOTE — Addendum Note (Signed)
 Addended by: PRENTISS FRIEZE R on: 09/02/2024 05:10 PM   Modules accepted: Orders

## 2024-09-02 NOTE — Telephone Encounter (Signed)
 Pharmacy Patient Advocate Encounter   Received notification from Onbase that prior authorization for Rybelsus  14MG  tablets  is required/requested.   Insurance verification completed.   The patient is insured through [facility].   Per test claim: PA required; PA started via CoverMyMeds. KEY [id number] . Please see clinical question(s) below that I am not finding the answer to in their chart and advise.

## 2024-09-09 ENCOUNTER — Encounter: Payer: Self-pay | Admitting: Family Medicine

## 2024-09-09 DIAGNOSIS — Z6829 Body mass index (BMI) 29.0-29.9, adult: Secondary | ICD-10-CM | POA: Insufficient documentation

## 2024-09-30 ENCOUNTER — Ambulatory Visit: Payer: Self-pay | Admitting: Family Medicine

## 2024-10-17 ENCOUNTER — Ambulatory Visit: Admitting: Family Medicine

## 2024-10-17 ENCOUNTER — Inpatient Hospital Stay (HOSPITAL_COMMUNITY): Admission: RE | Admit: 2024-10-17

## 2024-10-17 DIAGNOSIS — Z13820 Encounter for screening for osteoporosis: Secondary | ICD-10-CM

## 2024-11-14 ENCOUNTER — Ambulatory Visit: Admitting: Family Medicine
